# Patient Record
Sex: Female | Born: 1955 | Race: White | Hispanic: No | Smoking: Current every day smoker
Health system: Southern US, Community
[De-identification: ages and names within clinical notes are randomized; demographics above are authoritative.]

## PROBLEM LIST (undated history)

## (undated) DIAGNOSIS — F419 Anxiety disorder, unspecified: Secondary | ICD-10-CM

## (undated) DIAGNOSIS — E785 Hyperlipidemia, unspecified: Secondary | ICD-10-CM

## (undated) DIAGNOSIS — K631 Perforation of intestine (nontraumatic): Secondary | ICD-10-CM

## (undated) DIAGNOSIS — I10 Essential (primary) hypertension: Secondary | ICD-10-CM

## (undated) HISTORY — DX: Hyperlipidemia, unspecified: E78.5

## (undated) HISTORY — DX: Anxiety disorder, unspecified: F41.9

## (undated) HISTORY — PX: COLOSTOMY: SHX63

---

## 2014-12-10 ENCOUNTER — Inpatient Hospital Stay: Payer: Self-pay | Admitting: Surgery

## 2014-12-24 DIAGNOSIS — J439 Emphysema, unspecified: Secondary | ICD-10-CM | POA: Insufficient documentation

## 2014-12-24 DIAGNOSIS — I10 Essential (primary) hypertension: Secondary | ICD-10-CM | POA: Insufficient documentation

## 2014-12-24 DIAGNOSIS — Z9049 Acquired absence of other specified parts of digestive tract: Secondary | ICD-10-CM | POA: Insufficient documentation

## 2015-01-11 ENCOUNTER — Emergency Department: Payer: Self-pay | Admitting: Emergency Medicine

## 2015-02-24 LAB — SURGICAL PATHOLOGY

## 2015-03-02 NOTE — Discharge Summary (Signed)
PATIENT NAME:  Jacqueline Perez, Jacqueline Perez MR#:  [id number] DATE OF BIRTH:  October 22, 1956  DATE OF ADMISSION:  12/10/2014 DATE OF DISCHARGE:  12/20/2014  FINAL DIAGNOSES:  1. Perforated sigmoid diverticulitis.  2. Tobacco abuse and dependence.  3. Chronic obstructive pulmonary disease.   PRINCIPAL PROCEDURES:  1. Hartman procedure.  2. CT scan.  3. Internal medicine consultation.   HOSPITAL COURSE SUMMARY: The patient was admitted initially with what appeared to be Perez contained perforation of sigmoid diverticulitis. By hospital day number 1, she had an acute change in her in her clinical status with hypoxia and severe abdominal pain. She was taken to the Operating Room for Perez laparotomy and exploratory laparotomy and sigmoid colectomy with end colostomy and peritoneal lavage was performed. Postoperatively, she remained in the Intensive Care Unit due to hypoxia. She was able to be managed without ventilatory support. She was on high flow oxygen. Her nasogastric tube was able to be removed. She was continued on intravenous antibiotics postoperatively. On postoperative day number 2, she continued to have some mild hypoxia, however, her ostomy was pink and viable. She was started on clears. Intravenous antibiotics were continued. On postoperative day number 3, the patient continued to improve. Her Foley catheter was removed. She did not want any more intravenous antibiotics. This is causing her some hallucinations. She was transitioned over to oral pain medications. Her white count was 12.5. Her hemoglobin remained stable. Her ostomy began functioning. Her diet was able to be slowly advanced. Her oxygen was able to be weaned. By postoperative day number 4, the patient continued to improve. Her diet was able to be advanced. Her potassium was repleted. On postoperative day number 5, the patient had some ostomy care. She continued to improve. On postoperative day number 6, the patient looked to be continuing to need oxygen  with any type of mild exercise. Arrangements were made for her to potentially have home oxygen. She did have some flank pain on the 17th, which was thought to be musculoskeletal in  etiology. Perez CT scan of the chest was negative for pulmonary embolism. On the 18th, the patient's pain was resolved. Room air saturations with ambulating were 88%. The patient was discharged home in stable condition on 12/20/2014 with office followup.   MEDICATIONS: Can be found on reconciliation form.  ____________________________ Redge GainerMark Perez. Egbert GaribaldiBird, MD mab:ap D: 12/31/2014 19:31:30 ET T: 01/01/2015 09:13:20 ET JOB#: [id number]  cc: Loraine LericheMark Perez. Egbert GaribaldiBird, MD, <Dictator> Raynald KempMARK Perez Daila Elbert MD ELECTRONICALLY SIGNED 01/02/2015 14:47

## 2015-03-02 NOTE — Consult Note (Signed)
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   Patient was seen by consultant.   Consult note dictated.   Recommend further assessment or treatment.   Orders entered.   Comments: 1. Hypoxia, likley multifactorial, due 5to underlying emphysema  as pt reports intermittent desaturations in the past, also poor inspiratory effort due to abdominal pain, distension, opiate use in ER, get ABg's to r/o CO2 retention, order insentive spirometry, Duonebs, get sputum cx, now on zosyn 2. Myoclonus, r.o CO2 retention, ABG's 3. perforated viscus, pain meds, operative treatment as per surgery, agree with zosyn 4. tobacco abuse, nicotine replacement, d/w pt for about 5 minutes, agreeble 5 hyperglycemia, get hgb a1c Thanks for consult, we'll follow.  Electronic Signatures: Katharina CaperVaickute, Eboni Coval (MD)  (Signed 09-Feb-16 19:23)  Authored: Brief Consult Note   Last Updated: 09-Feb-16 19:23 by Katharina CaperVaickute, Gyneth Hubka (MD)

## 2015-03-02 NOTE — Op Note (Signed)
PATIENT NAME:  Jacqueline ForsterCLAYTON, Jacqueline Perez MR#:  [id number] DATE OF BIRTH:  Mar 02, 1956  DATE OF PROCEDURE:  12/11/2014  PREOPERATIVE DIAGNOSIS: Acute abdomen secondary to perforated diverticulitis.   POSTOPERATIVE DIAGNOSIS: Acute abdomen secondary to perforated diverticulitis.   PROCEDURE PERFORMED: Exploratory laparotomy with sigmoid colectomy, end sigmoid colostomy, and peritoneal lavage.   SURGEON: Colinda Barth Perez. Egbert GaribaldiBird, MD  ASSISTANT SURGEON: Sheppard Plumberimothy E. Oaks, MD   TYPE OF ANESTHESIA: General endotracheal.   FINDINGS: There was extensive contamination of the pelvis. There was perforated sigmoid diverticulum. The large bowel was thickened. There was Perez moderate amount of feculent and purulent fluid throughout the pelvis.   SPECIMENS: Sigmoid colon to pathology.   ESTIMATED BLOOD LOSS: 100 mL.   DRAINS: None.   LAPAROTOMY SPONGE AND NEEDLE COUNT: Correct x 2.   DESCRIPTION OF PROCEDURE: With informed consent obtained from the patient and her son, she was brought urgently to the operating room and positioned supine. General oroendotracheal anesthesia was induced without difficulty. The patient's abdomen was widely prepped and draped with ChloraPrep solution and Perez timeout was observed.   Perez midline skin incision was fashioned from above the umbilicus to just above the suprapubic midline, skin divided with scalpel, and musculofascial layers with both scalpel and electrocautery device, and Perez self-retaining abdominal retractor was placed. Seropurulent fluid was then aspirated from the abdomen at this time. The small bowel was packed into the upper abdomen, exposing the inflamed sigmoid diverticulitis in the pelvis. The sigmoid colon was then liberated off the white line of Toldt and the splenic flexure was likewise delivered at this time. This was accomplished by division of the lateral attachments and division of the omentum off the transverse mesocolon.   The proximal colon was divided with Perez fire of the  Contour stapler with Perez green load application. At this point, the sigmoid colon was then removed by division of the sigmoid mesocolon. This was accomplished with Perez combination of electrocautery and LigaSure apparatus, and larger vessels being individually clamped and tied utilizing #0 Vicryl suture. The course of the left ureter was identified and preserved. Distal to the area of inflammation, the colon was divided with Perez 2nd fire of the contoured stapler with green load application. The specimen was handed off the field and submitted to pathology. Gross examination was consistent with benign disease. The left lower quadrant ostomy site was chosen. One mesenteric pedicle was divided on the edge of the proximal mesentery, allowing full mobilization of the colon to create the ostomy. The abdomen was irrigated with several liters of warm normal saline with GU irrigant. Hemostasis appeared to be excellent on the operative field. The distal rectal stump was identified with Perez silk suture. With laparotomy sponge and needle count correct x 2, the abdomen was again irrigated, small bowel was run in its entirety. One area of small bowel in the ileum was separated from what appeared to be early interloop abscesses.   The left lower quadrant ostomy site was then chosen through the rectus sheath, below the umbilicus on the left side. Perez wheal of skin was excised. Perez cruciate incision was fashioned in the fascia anteriorly. The rectus muscle was split. The peritoneum was divided up likewise in Perez cruciate fashion, and the end of the sigmoid colon was easily brought up through this. It was secured at the abdominal side to the peritoneum with several #3-0 silk sutures. With laparotomy sponge and needle count correct x 2, the midline fascia was then reapproximated from the extremes of  the wound utilizing running looped #1 PDS. Subcutaneous tissues were irrigated and aspirated dry. Skin edges were reapproximated utilizing Perez skin  stapler. Being excluded from the wound, the ostomy was then matured by excision of the staple line, and the edges of the colon being secured to the edges of the skin, utilizing 3-0 chromic suture interrupted. The ostomy appeared congested, but viable. An ostomy appliance was placed. Sterile dressings were placed. The patient was subsequently extubated and taken to the recovery room in stable and satisfactory condition by anesthesia services.     ____________________________ Redge Gainer Egbert Garibaldi, MD mab:mw D: 12/12/2014 07:13:21 ET T: 12/12/2014 12:42:29 ET JOB#: [id number]  cc: Loraine Leriche Perez. Egbert Garibaldi, MD, <Dictator> Raynald Kemp MD ELECTRONICALLY SIGNED 12/17/2014 16:09

## 2015-03-02 NOTE — Consult Note (Signed)
PATIENT NAME:  Jacqueline Perez, CIRILLO MR#:  [id number] DATE OF BIRTH:  [birth year]  DATE OF CONSULTATION:  12/10/2014  ADMITTED BY:  Loraine Leriche A. Egbert Garibaldi, MD CONSULTING PHYSICIAN:  Katharina Caper, MD  HISTORY OF PRESENT ILLNESS: The patient is a 59 year old widowed Caucasian female with history of ongoing tobacco abuse, who has been smoking for the past about 40 pack years who presents to the hospital with abdominal pains. She had radiologic studies done, which were suggestive of perforated viscus and the patient was admitted by Dr. Egbert Garibaldi.  After admission, the patient was given some pain medication and she desaturated. Her O2 saturations went down to 80s. When she started walking her O2 saturations even worsened. The patient herself admits of having some intermittent desaturations in the past, but she denies any history of COPD.  She admits to coughing recently and having sore throat and just feeling bad. She does have yellow phlegm production for the past 1-2 days.  She has been also wheezing for the past 3-4 days and short of breath. She admits to having some right-sided chest pain radiating to her back coming from her abdomen. She tells me that she cannot take deep breath because of some pains in her abdomen as well as distention of her abdomen.  PAST MEDICAL HISTORY:  Significant for history of suspected COPD with desaturations, low oxygen levels in the past, history of sinus infection, and ongoing smoking, hypoglycemia, according to patient.  MEDICATIONS: None at home.   PAST SURGICAL HISTORY:  Tubal ligation in the past.   ALLERGIES: SULFA, WHICH GIVES HER HIVES.   FAMILY HISTORY: The patient's cousin had emphysema. Patient's grandmother with diabetes mellitus.  The patient's parents both had hypertension, maternal uncle had colon cancer.  SOCIAL HISTORY: The patient is single. Has a son and daughter. Has been smoking less than 3/4 of a pack of cigarettes for the past 42 years at least and drinks alcohol as  well, 1 bottle of wine once or twice a week. 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  She used to work in Public relations account executive, as well as Theatre stage manager.   REVIEW OF SYSTEMS:  CONSTITUTIONAL:  Positive for feeling feverish today, also feeling fatigue and weak, pains in the abdomen, as well as the rest of the chest radiating into the back. She admits of weight gain approximately 20-30 pounds in the past 2 years and there is some blurring of vision and evidence of cough, wheezing, dyspnea, shortness of breath and nasal bleed earlier today. Admits to chest pains in the past and feeling presyncopal. Admits to nausea and vomiting and abdominal pains in the lower abdomen mostly, but also radiating to the right side of the abdomen into the chest and into the back. Last bowel movement, normal bowel movement, was approximately a week ago. Denies any weight loss. EYES: Denies any double vision, glaucoma, cataracts. EARS, NOSE, THROAT: Denies any tinnitus, allergies, epistaxis, sinus pain, dentures, difficulty swallowing. Admits to having bleeding nose after blowing nose. RESPIRATORY: Denies any asthma, COPD.  CARDIOVASCULAR: Denies any orthopnea, edema, arrhythmias, palpitations. GASTROINTESTINAL: Denies any diarrhea, hematemesis, rectal bleeding, change in bowel habits.  GENITOURINARY: Denies dysuria, hematuria, frequency or incontinence. ENDOCRINOLOGY: Denies any polydipsia, nocturia, thyroid problems, heat or cold intolerance, or thirst.  HEMATOLOGIC: Denies anemia, easy bruising, or bleeding, swollen glands.  SKIN: Denies acne, rash, lesions, or change in moles.  MUSCULOSKELETAL: Denies arthritis, cramps, or swelling. NEUROLOGIC: Denies numbness, epilepsy, or tremor.  PSYCHIATRIC: Denies anxiety, insomnia, or depression.  PHYSICAL EXAMINATION:  VITAL SIGNS: On arrival to the hospital the patient's temperature was 98, pulse was 93, respiratory rate was 21, blood  pressure 124/78, saturation was 94% on room air. GENERAL: The patient is a well-developed, well-nourished, Caucasian female in mild to moderate discomfort, lying on the stretcher. She is moving on the stretcher from 1 side to the other side because of the pain and discomfort. HEENT: Her pupils are equal, reactive to light. Extraocular muscles intact, no icterus or conjunctivitis. Has normal hearing. No pharyngeal erythema. Mucosa is dry. NECK: No masses. Supple, nontender. Thyroid is not enlarged. No adenopathy. No JVD or carotid bruits bilaterally. Full range of motion.  LUNGS: Clear to auscultation, somewhat diminished breath sounds, but there were no rales or rhonchi, wheezing, no dullness to percussion, not in overt respiratory distress; however, patient guarding to take a deep breath because of abdominal distention as well as discomfort.  CARDIOVASCULAR: S1, S2 appreciated. The rhythm is regular. PMI not lateralized. Chest is nontender to palpation,  1+ pedal pulses.  No lower extremity edema, calf tenderness, or cyanosis was noted.   ABDOMEN:  Distended, mildly firm. No hepatosplenomegaly or masses were noted.  Bowel sounds were diminished.  MUSCLE STRENGTH: Able to move all extremities. No cyanosis, degenerative joint disease, or kyphosis. Gait was not tested.  SKIN: Did not reveal any rashes, lesions, erythema, nodularity, or induration. It was warm and dry to palpation. LYMPHATIC: No adenopathy in the cervical region.  NEUROLOGIC: Cranial nerves were intact.  Sensory was intact.  No dysarthria or aphasia.  PSYCHIATRIC: The patient is alert, oriented to person and place, but patient is somnolent intermittently and she has myoclonic jerks. She is awakened though and she is able to communicate, but otherwise poorly cooperative. She is somewhat dramatic and restless.  LABORATORY DATA: BMP showed a glucose level of 121, lipase level of 60. Otherwise BMP was normal. Liver enzymes were normal. Cardiac  enzymes, troponin was less than 0.02. White blood cell count was normal at 8.0, hemoglobin was 14.8. Platelet count was 274,000. Urinalysis was remarkable for yellow clear urine, negative for glucose or bilirubin, 1+ ketones were noted. Specific gravity 1.015, pH was 6.0, negative for blood, protein, nitrites or leukocyte esterase, 1 red blood cell, less than 1 white blood cell. No bacteria, less than 1 epithelial cell, mucus was present as well as to 5 hyaline casts.   RADIOLOGIC STUDIES: Chest x-ray, portable, single view, 12/10/2014, showed low lung volumes. No definite acute disease. CT scan of abdomen and pelvis with contrast, 12/10/2014, revealed extraluminal gas bubbles and fluid in the pelvis indicating perforated viscus, probably secondary to diverticulitis or perforated sigmoid carcinoma. Recommend surgical consultation. Normal appendix as well as cholelithiasis. Ultrasound of abdomen, general survey, 12/10/2014, showed multiple gallstones, nonvisualization of the pancreas and distal abdominal aorta.   ASSESSMENT AND PLAN: 1.  Hypoxia, likely multifactorial due to underlying emphysema as patient reports intermittent desaturations in the past, also poor inspiratory effort due to abdominal pain and distention and opiate use in the Emergency Room. We will get ABGs to rule out CO2 retention and we will order incentive spirometry, also DuoNeb. Will get sputum cultures and will continue the patient on Zosyn.  2.  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 Will get ABGs.  3.  Perforated viscus.  Pain medications as per surgery. Operative therapy as per surgery. Will  agree to Zosyn and follow the patient's ABGs and make decisions about BiPAP if needed.  4.  Hyperglycemia. Get hemoglobin A1c.  5.  Tobacco abuse. Nicotine replacement therapy will be initiated. This was discussed, cessation, for approximately 4-5 minutes. She was agreeable.  TIME SPENT: 50 minutes.    ____________________________ Katharina Caper, MD rv:LT D: 12/10/2014 19:32:09 ET T: 12/10/2014 20:34:38 ET JOB#: [id number]  cc: Katharina Caper, MD, <Dictator> Cesilia Shinn MD ELECTRONICALLY SIGNED 01/14/2015 10:44

## 2015-03-02 NOTE — H&P (Signed)
Subjective/Chief Complaint severe lower abdominal pain   History of Present Illness 59 y/o smoker without medical history presents with about 1 week history of lower abdominal pain, became abruptly worse in last 12 hours with distension, nausea.  Arrives in ER with severe abdominal pain and distension.  No fevers, no sick contacts.   Past History smoker.   Code Status Full Code   Past Med/Surgical Hx:  HTN:   Tubal Ligation:   ALLERGIES:  Sulfa drugs: Rash  Family and Social History:  Family History Smoking   Social History negative tobacco, negative ETOH, negative Illicit drugs   + Tobacco Current (within 1 year)   Place of Living Home   Review of Systems:  Subjective/Chief Complaint see above.   Abdominal Pain Yes   Diarrhea No   Constipation No   Nausea/Vomiting Yes   SOB/DOE No   Chest Pain No   Tolerating Diet No  Nauseated   ROS remaing review of system negative.   Medications/Allergies Reviewed Medications/Allergies reviewed   Physical Exam:  GEN disheveled, critically ill appearing, 98 p92 124/78   HEENT pale conjunctivae, PERRL   NECK supple  No masses   RESP normal resp effort  clear BS   CARD regular rate   ABD positive tenderness  no hernia  soft  distended  normal BS  tenderness throughout the abdomen,   LYMPH negative neck   EXTR negative cyanosis/clubbing   SKIN normal to palpation, No rashes   NEURO cranial nerves intact   PSYCH A+O to time, place, person, good insight   Lab Results: Hepatic:  09-Feb-16 11:47   Bilirubin, Total 0.6  Alkaline Phosphatase 72  SGPT (ALT) 24  SGOT (AST) 23  Total Protein, Serum 7.4  Albumin, Serum 3.6  Routine Chem:  09-Feb-16 11:47   Lipase  60 (Result(s) reported on 10 Dec 2014 at 01:59PM.)  Glucose, Serum  121  BUN 13  Creatinine (comp) 0.85  Sodium, Serum 139  Potassium, Serum 3.6  Chloride, Serum 105  CO2, Serum 25  Calcium (Total), Serum 9.0  Osmolality (calc) 279  eGFR  (African American) >60  eGFR (Non-African American) >60 (eGFR values <58m/min/1.73 m2 may be an indication of chronic kidney disease (CKD). Calculated eGFR, using the MRDR Study equation, is useful in  patients with stable renal function. The eGFR calculation will not be reliable in acutely ill patients when serum creatinine is changing rapidly. It is not useful in patients on dialysis. The eGFR calculation may not be applicable to patients at the low and high extremes of body sizes, pregnant women, and vegetarians.)  Anion Gap 9  Cardiac:  09-Feb-16 11:47   Troponin I < 0.02 (0.00-0.05 0.05 ng/mL or less: NEGATIVE  Repeat testing in 3-6 hrs  if clinically indicated. >0.05 ng/mL: POTENTIAL  MYOCARDIAL INJURY. Repeat  testing in 3-6 hrs if  clinically indicated. NOTE: An increase or decrease  of 30% or more on serial  testing suggests a  clinically important change)  Routine UA:  09-Feb-16 11:47   Color (UA) Yellow  Clarity (UA) Clear  Glucose (UA) Negative  Bilirubin (UA) Negative  Ketones (UA) 1+  Specific Gravity (UA) 1.015  Blood (UA) Negative  pH (UA) 6.0  Protein (UA) Negative  Nitrite (UA) Negative  Leukocyte Esterase (UA) Negative (Result(s) reported on 10 Dec 2014 at 03:31PM.)  RBC (UA) 1 /HPF  WBC (UA) <1 /HPF  Bacteria (UA) NONE SEEN  Epithelial Cells (UA) <1 /HPF  Mucous (UA) PRESENT  Hyaline Cast (UA) 5 /LPF (Result(s) reported on 10 Dec 2014 at 03:31PM.)  Routine Hem:  09-Feb-16 11:47   WBC (CBC) 8.0  RBC (CBC) 4.52  Hemoglobin (CBC) 14.8  Hematocrit (CBC) 44.9  Platelet Count (CBC) 274 (Result(s) reported on 10 Dec 2014 at 01:49PM.)  MCV 99  MCH 32.8  MCHC 33.0  RDW 13.2   Radiology Results: [place]:    09-Feb-16 13:17, US Abdomen General Survey  US Abdomen General Survey  REASON FOR EXAM:    abd pain  COMMENTS:       PROCEDURE: [place]  - US ABDOMEN GENERAL SURVEY  - Dec 10 2014  1:17PM     CLINICAL DATA:  Progressive abdominal pain.   Nausea.    EXAM:  ULTRASOUND ABDOMEN COMPLETE    COMPARISON:  None.    FINDINGS:  Gallbladder: There are multiple mobile gallstones. Gallbladder wall  is not thickened.  Common bile duct: Diameter: 5.1 mm, normal.    Liver: No focal lesion identified. Within normal limits in  parenchymal echogenicity.    IVC: No abnormality visualized.    Pancreas: Not visualized.    Spleen: Normal.  9.1 cm in length.    Right Kidney: Length: 10.1 cm. Echogenicity within normal limits. No  mass or hydronephrosis visualized.    Left Kidney: Length: 10.2 cm. Echogenicity within normal limits. No  mass or hydronephrosis visualized.    Abdominal aorta: Incompletely visualized. Proximal portion is 2.7 cm  maximal diameter.    Other findings: None.     IMPRESSION:  Multiple gallstones. Nonvisualization of the pancreas and distal  abdominal aorta.      Electronically Signed    By: Lorriane Shire M.D.    On: 12/10/2014 13:41     Verified By: Larey Seat, M.D.,  [place]:  PACS Image    09-Feb-16 15:39, CT Abdomen and Pelvis With Contrast  PACS Image  CT:  CT Abdomen and Pelvis With Contrast  REASON FOR EXAM:    (1) abd pain; (2) pel pain  COMMENTS:       PROCEDURE: CT  - CT ABDOMEN / PELVIS  W  - Dec 10 2014  3:39PM     CLINICAL DATA:  Lower abdominal pain since last Tuesday. Patient  states pain began approx one week ago, but was not constant. Patient  states she developed severe RUQ last night. +Nausea.Hx of Tubal  Ligation.    EXAM:  CT ABDOMEN AND PELVIS WITH CONTRAST    TECHNIQUE:  Multidetector CT imaging of the abdomen and pelvis was performed  using the standard protocol following bolus administration of  intravenous contrast.    CONTRAST:  100 mL Omnipaque 300 IV    COMPARISON:  None.    FINDINGS:  Minimal dependent atelectasis in the visualized lung bases. There is  a small amount of perihepatic ascites. Multiple partially calcified  stones in the  dependent aspect of the nondilated gallbladder.  Unremarkable liver, spleen, adrenal glands, kidneys, pancreas.  Aortoiliac atheromatous plaque without aneurysm or stenosis. Stomach  is physiologically distended with oral contrast. Small bowel and  colon are nondilated. Normal appendix. Multiple diverticula from  distal descending and sigmoid portions of the colon. There is  irregular wall thickening in the mid sigmoid colon with adjacent  inflammatory/ edematous changes. Scattered extraluminal small gas  bubbles in the right pelvis with a small amount of ascites. Uterus  and adnexal regions unremarkable. Bilateral pelvic phleboliths. No  adenopathy. Grade 1 anterolisthesis L5-S1 with  advanced degenerative  disc disease. No pars defect.     IMPRESSION:  1. Extraluminal gas bubbles and fluid in the pelvis indicating  perforated viscus, probably secondary to diverticulitis or  perforated sigmoid carcinoma. Recommend surgical consultation.  2. Normal appendix.  3.Cholelithiasis.  Electronically Signed    By: Lucrezia Europe M.D.    On: 12/10/2014 16:07         Verified By: Kandis Cocking, M.D.,    Assessment/Admission Diagnosis 59 y/o female with contained perforation sigmoid diverticulitis no prior colonoscopy pain looks more severe than I would expect given CT scan findings.   Plan admit, npo, hydration IV zosyn/flagyl. serial exam. I will re-assess and suspect if no improvement may need surgical intervention.   Electronic Signatures: Sherri Rad (MD)  (Signed 09-Feb-16 17:21)  Authored: CHIEF COMPLAINT and HISTORY, PAST MEDICAL/SURGIAL HISTORY, ALLERGIES, FAMILY AND SOCIAL HISTORY, REVIEW OF SYSTEMS, PHYSICAL EXAM, LABS, Radiology, ASSESSMENT AND PLAN   Last Updated: 09-Feb-16 17:21 by Sherri Rad (MD)

## 2016-05-16 IMAGING — CR DG CHEST 1V
1 series · 1 of 1 positions shown · non-contrast
Comparison: 12/14/2014

CLINICAL DATA: Shortness of breath

EXAM:
CHEST  1 VIEW

[ap]
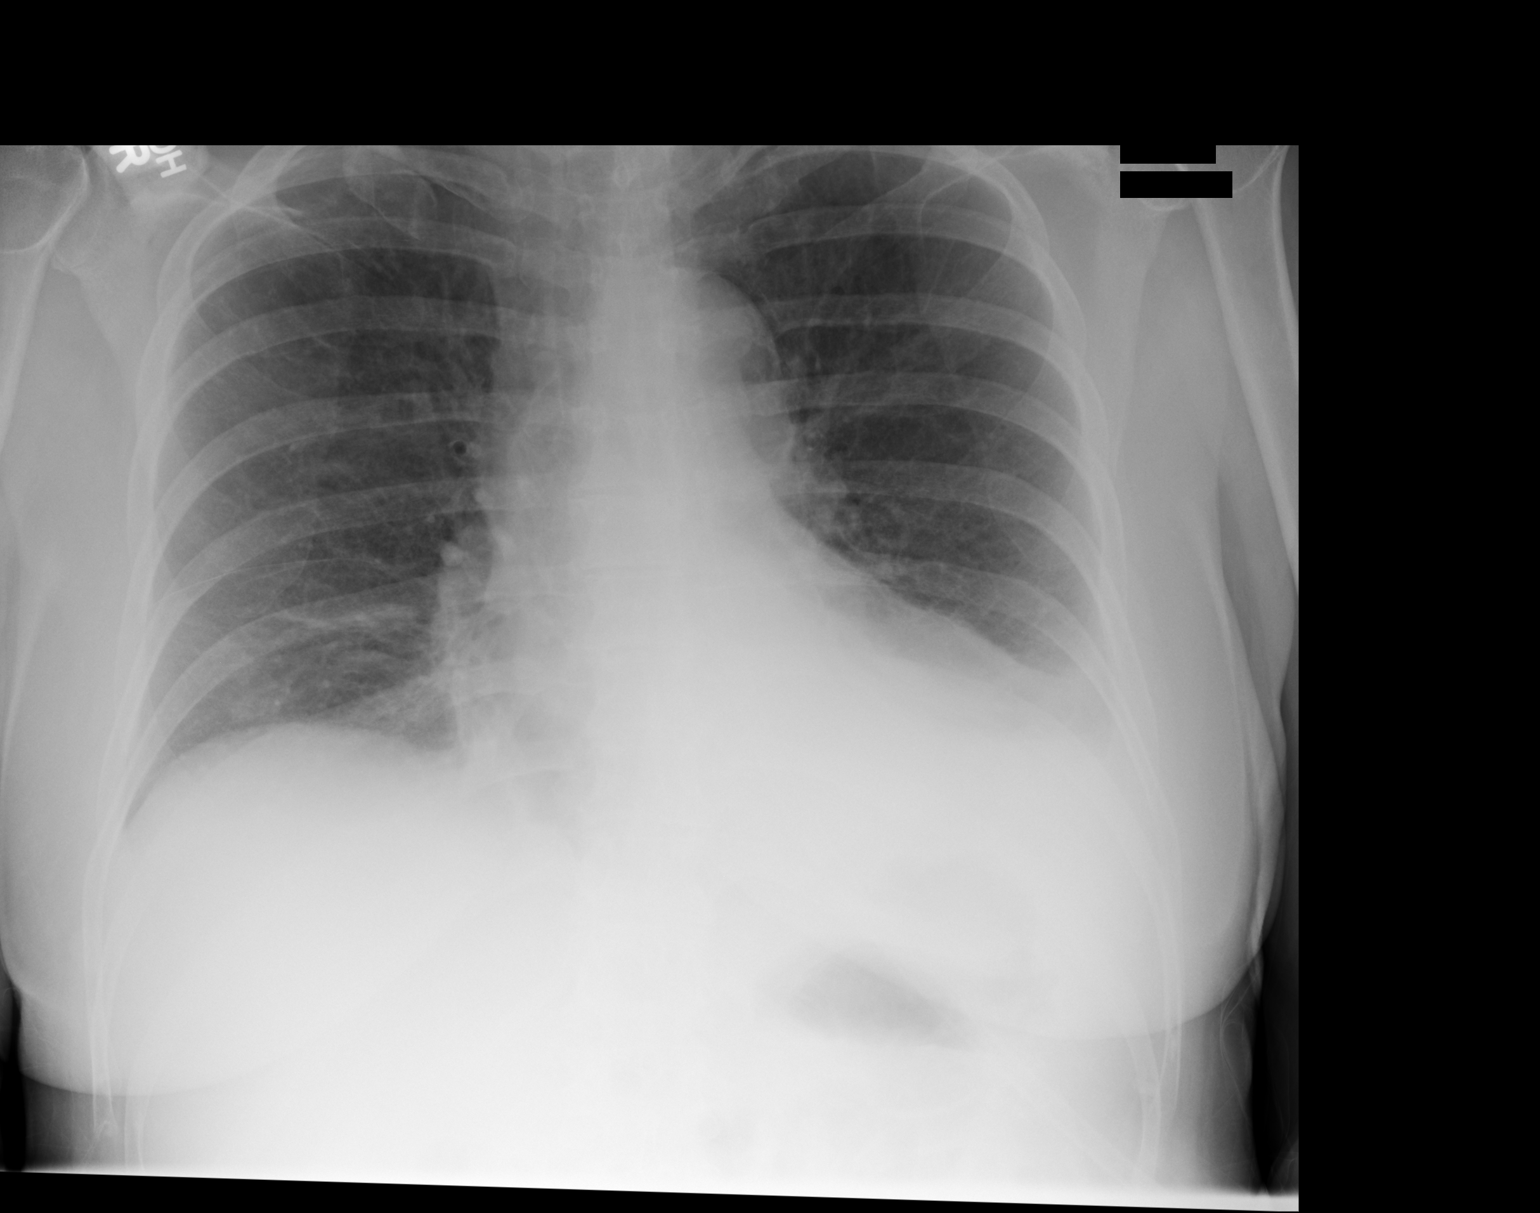

[1 of 1 positions shown; findings below may reference images not displayed]

FINDINGS: Aeration is improved with apparent improvement in previously seen
interstitial edema. Residual trace left pleural effusion is
identified. Mild enlargement of the cardiac silhouette is noted. The
aorta is unfolded and ectatic.
IMPRESSION: Improving probable interstitial pulmonary edema.

## 2016-06-22 DIAGNOSIS — I10 Essential (primary) hypertension: Secondary | ICD-10-CM

## 2016-06-22 DIAGNOSIS — J439 Emphysema, unspecified: Secondary | ICD-10-CM

## 2016-06-22 DIAGNOSIS — Z9049 Acquired absence of other specified parts of digestive tract: Secondary | ICD-10-CM

## 2016-11-10 ENCOUNTER — Telehealth: Payer: Self-pay | Admitting: Pharmacist

## 2016-11-10 NOTE — Telephone Encounter (Signed)
ProAir refill PAP submitted to manufacturer today. °

## 2017-01-28 ENCOUNTER — Telehealth: Payer: Self-pay | Admitting: Pharmacist

## 2017-01-28 NOTE — Telephone Encounter (Signed)
01/28/17 Faxed PAP Renewal application to [employer] for ProAir Inhale 2 puffs every 4-6 hours as needed for wheezing, cough or shortness of breath.

## 2017-02-18 ENCOUNTER — Telehealth: Payer: Self-pay | Admitting: Pharmacist

## 2017-02-18 NOTE — Telephone Encounter (Signed)
02/18/2017 Patient eligibile with [facility] till April 2019. AJohnson

## 2017-03-16 ENCOUNTER — Emergency Department
Admission: EM | Admit: 2017-03-16 | Discharge: 2017-03-16 | Disposition: A | Discharge status: Home / Self Care | Payer: Self-pay | Attending: Emergency Medicine | Admitting: Emergency Medicine

## 2017-03-16 ENCOUNTER — Emergency Department: Payer: Self-pay

## 2017-03-16 ENCOUNTER — Encounter: Payer: Self-pay | Admitting: Emergency Medicine

## 2017-03-16 DIAGNOSIS — R1013 Epigastric pain: Secondary | ICD-10-CM | POA: Insufficient documentation

## 2017-03-16 DIAGNOSIS — I10 Essential (primary) hypertension: Secondary | ICD-10-CM | POA: Insufficient documentation

## 2017-03-16 DIAGNOSIS — F17210 Nicotine dependence, cigarettes, uncomplicated: Secondary | ICD-10-CM | POA: Insufficient documentation

## 2017-03-16 DIAGNOSIS — R079 Chest pain, unspecified: Secondary | ICD-10-CM | POA: Insufficient documentation

## 2017-03-16 HISTORY — DX: Perforation of intestine (nontraumatic): K63.1

## 2017-03-16 HISTORY — DX: Essential (primary) hypertension: I10

## 2017-03-16 LAB — CBC
HCT: 37.1 % (ref 35.0–47.0)
Hemoglobin: 13.1 g/dL (ref 12.0–16.0)
MCH: 35 pg — ABNORMAL HIGH (ref 26.0–34.0)
MCHC: 35.3 g/dL (ref 32.0–36.0)
MCV: 99.3 fL (ref 80.0–100.0)
PLATELETS: 351 10*3/uL (ref 150–440)
RBC: 3.73 MIL/uL — ABNORMAL LOW (ref 3.80–5.20)
RDW: 13.1 % (ref 11.5–14.5)
WBC: 7.1 10*3/uL (ref 3.6–11.0)

## 2017-03-16 LAB — HEPATIC FUNCTION PANEL
ALBUMIN: 4.2 g/dL (ref 3.5–5.0)
ALT: 23 U/L (ref 14–54)
AST: 33 U/L (ref 15–41)
Alkaline Phosphatase: 46 U/L (ref 38–126)
BILIRUBIN TOTAL: 0.6 mg/dL (ref 0.3–1.2)
Bilirubin, Direct: <0.1 mg/dL — ABNORMAL LOW (ref 0.1–0.5)
Total Protein: 7.2 g/dL (ref 6.5–8.1)

## 2017-03-16 LAB — BASIC METABOLIC PANEL
Anion gap: 9 (ref 5–15)
BUN: 17 mg/dL (ref 6–20)
CALCIUM: 9.5 mg/dL (ref 8.9–10.3)
CHLORIDE: 105 mmol/L (ref 101–111)
CO2: 25 mmol/L (ref 22–32)
CREATININE: 0.9 mg/dL (ref 0.44–1.00)
Glucose, Bld: 89 mg/dL (ref 65–99)
Potassium: 3.8 mmol/L (ref 3.5–5.1)
SODIUM: 139 mmol/L (ref 135–145)

## 2017-03-16 LAB — TROPONIN I

## 2017-03-16 LAB — LIPASE, BLOOD: LIPASE: 40 U/L (ref 11–51)

## 2017-03-16 NOTE — ED Notes (Addendum)
Pt called to inform her that discharge paperwork would be left at front desk. Pt stated that she received paperwork by nurse "at the front". Erskine SquibbJane first nurse called, she stated that pt was in lobby and came up to front desk and asked for paperwork. Erskine SquibbJane stated that she retrieved paperwork from MD Schaevitz and went over it w/ pt.

## 2017-03-16 NOTE — ED Provider Notes (Signed)
[facility] Emergency Department Provider Note  ____________________________________________   First MD Initiated Contact with Patient 03/16/17 1652     (approximate)  I have reviewed the triage vital signs and the nursing notes.   HISTORY  Chief Complaint Chest Pain   HPI Jacqueline Perez is a 61 y.o. female with a history of hypertension as well as smoking who is presenting to the emergency department today with 3 hours of epigastric and chest pain. She says the pain started suddenly at about noon and lasted for 3 hours. She describes as a severe burning pain that was about a 9 out of 10. She says it radiated to both shoulders to her back into her jaw. She says that it felt like "severe indigestion." She says that it was constant for about 3 hours until she burped and then it started to alleviate. She is currently pain-free at this time. Denies any difficulty breathing. Says that she was cleaning when the pain started. Has not had episodes of pain like this before. Is a smoker but denies any coronary artery disease. No history of coronary artery disease in her family.Patient said that she was nauseous but did not vomit. No diaphoresis. Triage note says that she was short of breath. However, when I discussed with her what it meant to be short of breath she denies any difficulty with breathing and any rapid respirations.   Past Medical History:  Diagnosis Date  . Hypertension   . Rupture of colon [facility])     Patient Active Problem List   Diagnosis Date Noted  . Hypertension 12/24/2014  . Emphysema of lung (HCC) 12/24/2014  . S/P colon resection 12/24/2014    Past Surgical History:  Procedure Laterality Date  . COLOSTOMY      Prior to Admission medications   Medication Sig Start Date End Date Taking? Authorizing Provider  albuterol (VENTOLIN HFA) 108 (90 Base) MCG/ACT inhaler Inhale 2 puffs into the lungs every 4 (four) hours as needed for wheezing or  shortness of breath.   Yes [provider]  amLODipine (NORVASC) 10 MG tablet Take 10 mg by mouth daily.   Yes [provider]  aspirin EC 81 MG tablet Take 81 mg by mouth daily.   Yes [provider]  Garlic 2000 MG CAPS Take 2,000 mg by mouth daily.   Yes [provider]  hydrOXYzine (ATARAX/VISTARIL) 25 MG tablet Take 25 mg by mouth every 6 (six) hours as needed for anxiety. 02/25/17  Yes [provider]  ibuprofen (ADVIL,MOTRIN) 800 MG tablet Take 800 mg by mouth every 8 (eight) hours as needed for pain. 01/28/17  Yes [provider]  lidocaine (LIDODERM) 5 % Place 1 patch onto the skin daily as needed. Apply to affected area for 12 hours   Yes [provider]  losartan (COZAAR) 50 MG tablet Take 50 mg by mouth daily.   Yes [provider]  sertraline (ZOLOFT) 25 MG tablet Take 25 mg by mouth daily. 02/26/17 03/28/17 Yes [provider]    Allergies Morphine and related and Sulfa antibiotics  No family history on file.  Social History Social History  Substance Use Topics  . Smoking status: Current Every Day Smoker    Types: Cigarettes  . Smokeless tobacco: Never Used  . Alcohol use Yes    Review of Systems  Constitutional: No fever/chills Eyes: No visual changes. ENT: No sore throat. Cardiovascular:as above Respiratory: Denies shortness of breath. Gastrointestinal: No abdominal  pain.  No nausea, no vomiting.  No diarrhea.  No constipation. Genitourinary: Negative for dysuria. Musculoskeletal: Negative for back pain. Skin: Negative for rash. Neurological: Negative for headaches, focal weakness or numbness.   ____________________________________________   PHYSICAL EXAM:  VITAL SIGNS: ED Triage Vitals [03/16/17 1420]  Enc Vitals Group     BP 111/68     Pulse Rate 63     Resp 20     Temp 97.8 F (36.6 C)     Temp Source Oral     SpO2 99 %     Weight 132 lb (59.9 kg)     Height 5' 3.5"  (1.613 m)     Head Circumference      Peak Flow      Pain Score 8     Pain Loc      Pain Edu?      Excl. in GC?     Constitutional: Alert and oriented. Well appearing and in no acute distress. Eyes: Conjunctivae are normal.  Head: Atraumatic. Nose: No congestion/rhinnorhea. Mouth/Throat: Mucous membranes are moist.  Neck: No stridor.   Cardiovascular: Normal rate, regular rhythm. Grossly normal heart sounds.   Respiratory: Normal respiratory effort.  No retractions. Lungs CTAB. Gastrointestinal: Soft and nontender. No distention. No CVA tenderness. Musculoskeletal: No lower extremity tenderness nor edema.  No joint effusions. Neurologic:  Normal speech and language. No gross focal neurologic deficits are appreciated. Skin:  Skin is warm, dry and intact. No rash noted. Psychiatric: Mood and affect are normal. Speech and behavior are normal.  ____________________________________________   LABS (all labs ordered are listed, but only abnormal results are displayed)  Labs Reviewed  CBC - Abnormal; Notable for the following:       Result Value   RBC 3.73 (*)    MCH 35.0 (*)    All other components within normal limits  HEPATIC FUNCTION PANEL - Abnormal; Notable for the following:    Bilirubin, Direct <0.1 (*)    All other components within normal limits  BASIC METABOLIC PANEL  TROPONIN I  LIPASE, BLOOD   ____________________________________________  EKG  ED ECG REPORT I, Arelia LongestSchaevitz,  Azia Toutant M, the attending physician, personally viewed and interpreted this ECG.   Date: 03/16/2017  EKG Time: 1416  Rate: 66  Rhythm: normal EKG, normal sinus rhythm, unchanged from previous tracings, normal sinus rhythm  Axis: Normal  Intervals:none  ST&T Change: No ST segment elevation or depression. T-wave inversions in V2 and V3 which are unchanged from the previous EKG on the record.  ____________________________________________  RADIOLOGY On chest x-ray` Mild atelectasis. Improved  effusion. ____________________________________________   PROCEDURES  Procedure(s) performed:   Procedures  Critical Care performed:   ____________________________________________   INITIAL IMPRESSION / ASSESSMENT AND PLAN / ED COURSE  Pertinent labs & imaging results that were available during my care of the patient were reviewed by me and considered in my medical decision making (see chart for details).  ----------------------------------------- 5:25 PM on 03/16/2017 -----------------------------------------  Patient saying that she must leave at this time. She is asymptomatic. I did discuss with her for the lab work in the emergency department to make sure that there is no stress on her heart and that she does not need to stay in the hospital. However, she is insistent on leaving. She is understanding of the severity of her illness and has capacity and insight to make her medical decisions. We discussed following up within 72 hours with a cardiologist. She says that she will  call immediately tomorrow morning once her doctor's office opens for referral for cardiology.      ____________________________________________   FINAL CLINICAL IMPRESSION(S) / ED DIAGNOSES  Chest pain    NEW MEDICATIONS STARTED DURING THIS VISIT:  New Prescriptions   No medications on file     Note:  This document was prepared using Dragon voice recognition software and may include unintentional dictation errors.     Myrna Blazer, MD 03/16/17 1728

## 2017-03-16 NOTE — ED Triage Notes (Signed)
Pt reports central chest pain that radiates to both shoulders and both sides of jaw and back. Pt reports nausea and SOB. Pt states she has been under a lot of stress lately as well.

## 2017-03-16 NOTE — ED Notes (Signed)
Pt no longer in room. Personal belongings no longer in room, officer sts that she saw female leaving room.  Pt VSS, ambulatory at baseline.

## 2017-04-20 ENCOUNTER — Telehealth: Payer: Self-pay | Admitting: Pharmacist

## 2017-04-20 NOTE — Telephone Encounter (Signed)
04/20/17 Called [employer] for refill on ProAir.

## 2017-07-05 ENCOUNTER — Telehealth: Payer: Self-pay | Admitting: Pharmacist

## 2017-07-05 NOTE — Telephone Encounter (Signed)
07/05/17 Received notice from [employer] for refill on Liberty MediaPro Air, marked to refill and faxed back to [employer], Patient ID# [id number].Forde RadonAJ

## 2017-12-26 ENCOUNTER — Encounter (INDEPENDENT_AMBULATORY_CARE_PROVIDER_SITE_OTHER): Payer: Self-pay

## 2017-12-26 ENCOUNTER — Ambulatory Visit: Payer: Self-pay | Admitting: Pharmacist

## 2017-12-26 ENCOUNTER — Other Ambulatory Visit: Payer: Self-pay

## 2017-12-26 DIAGNOSIS — Z79899 Other long term (current) drug therapy: Secondary | ICD-10-CM | POA: Insufficient documentation

## 2017-12-26 NOTE — Progress Notes (Signed)
  [facility] Visit Note  Patient: Jacqueline Perez MRN: [id number] Date of Birth: 11-27-1955 PCP: Pricilla HolmSharpe, Leslie M, MD   Jacqueline Perez 62 y.o. female presents for a follow up MTM visit at medication management clinic today.  Chief complaint of back pain, which she associates with a possible UTI. Patient states she was breaking out in a sweat while in waiting room and has felt 'not herself'. Has appointment with doctor tomorrow morning.   BP (!) 142/80 (BP Location: Right Arm)   Wt 124 lb (56.2 kg)   BMI 21.62 kg/m   Patient Information   Past Medical History:  Diagnosis Date  . Anxiety   . Hyperlipidemia   . Hypertension   . Rupture of colon [facility](HCC)       Past Surgical History:  Procedure Laterality Date  . COLOSTOMY      No family history on file.     Social History   Substance and Sexual Activity  Alcohol Use Yes  . Alcohol/week: 0.6 oz  . Types: 1 Glasses of wine per week      Social History   Tobacco Use  Smoking Status Current Every Day Smoker  . Packs/day: 0.50  . Types: Cigarettes  Smokeless Tobacco Never Used      Health Maintenance  Topic Date Due  . Hepatitis C Screening  [id number]  . HIV Screening  05/19/1969  . TETANUS/TDAP  05/19/1973  . PAP SMEAR  05/20/1975  . MAMMOGRAM  05/19/2004  . COLONOSCOPY  05/19/2004  . INFLUENZA VACCINE  06/01/2017    Assessment and Plan:  1. Compliance: Good; patient manages medications well. She knows when and why she is taking all her medications.   2. Anxiety: Sertraline and clonazepam. Patient states she is most nervous upon waking in the morning and takes her clonazepam when she wakes up. She reports much stress in her life.   3. HTN: Amlodipine and losartan  4. HLD: Lipid panel not available; patient states she is followed by [facility] and they follow her lipids. Currently on garlic and fish oil. Statins cause myalgias.   5. Smoker: Current 0.5 PPD smoker. Has  attempted to quit before with patches. States she is ready to quit but has not set a quit date. Admits to increasing stress more recently and therefore has made it hard for her to think of quitting right now.   Patient has doctor appointment scheduled for tomorrow morning due to back pain/UTI.   Will follow up with MTM appointment at medication management clinic in 1 year or sooner if any questions or concerns arise.   Cleopatra CedarStephanie Ritaj Dullea, PharmD Pharmacy Resident

## 2018-01-02 ENCOUNTER — Telehealth: Payer: Self-pay | Admitting: Pharmacist

## 2018-01-02 NOTE — Telephone Encounter (Signed)
01/02/18 Printed [employer] renewal application for Liberty MediaPro Air HFA 0.09mg  Inhale 2 puffs every 4 hours, mailing patient her portion to sign & return, also mailing provider Deloris PingLeslie Sharpe, FNP [facility] her portion to sign & return.Forde RadonAJ

## 2018-01-24 ENCOUNTER — Telehealth: Payer: Self-pay | Admitting: Pharmacist

## 2018-01-24 NOTE — Telephone Encounter (Signed)
01/24/2018 9:15:29 AM - ProAir HFA renewal  01/24/18 Faxed [employer] application for renewal-ProAir HFA 0.09mg  Inhale 2 puffs every 4 hours.Forde RadonAJ

## 2018-02-06 NOTE — Telephone Encounter (Signed)
11/10/2016 9:07:33 AM - Pro Air refill  Received notice from [employer] for refill on [employer]Pro Air, checked and faxed back to [employer] for processing.

## 2018-08-14 IMAGING — CR DG CHEST 2V
1 series · 2 of 2 positions shown · non-contrast
Comparison: Radiograph December 18, 2014.

CLINICAL DATA: Chest pain.

EXAM:
CHEST  2 VIEW

[Series 1: dg chest 2 view · 0.14mm/px · 2 of 2 slices shown]
[im 1/2]
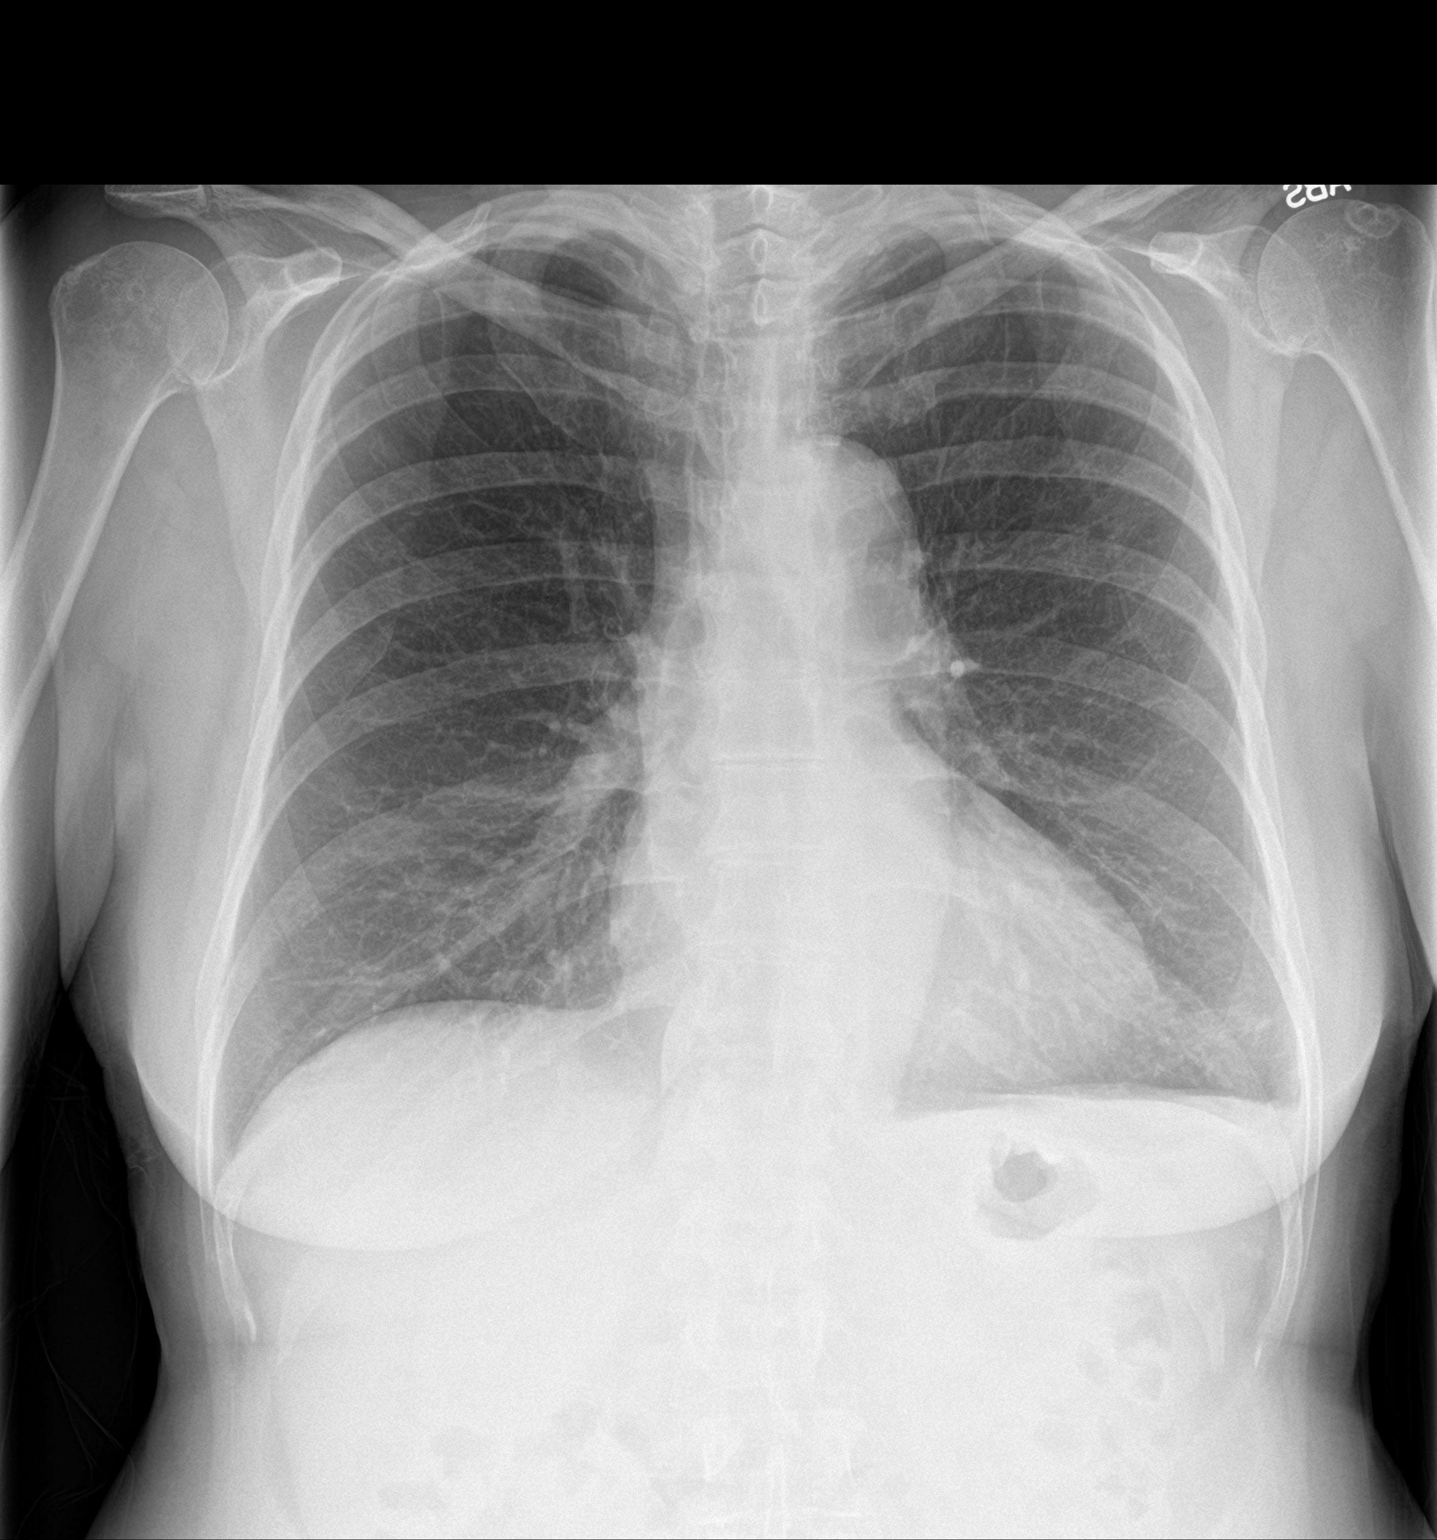
[im 2/2]
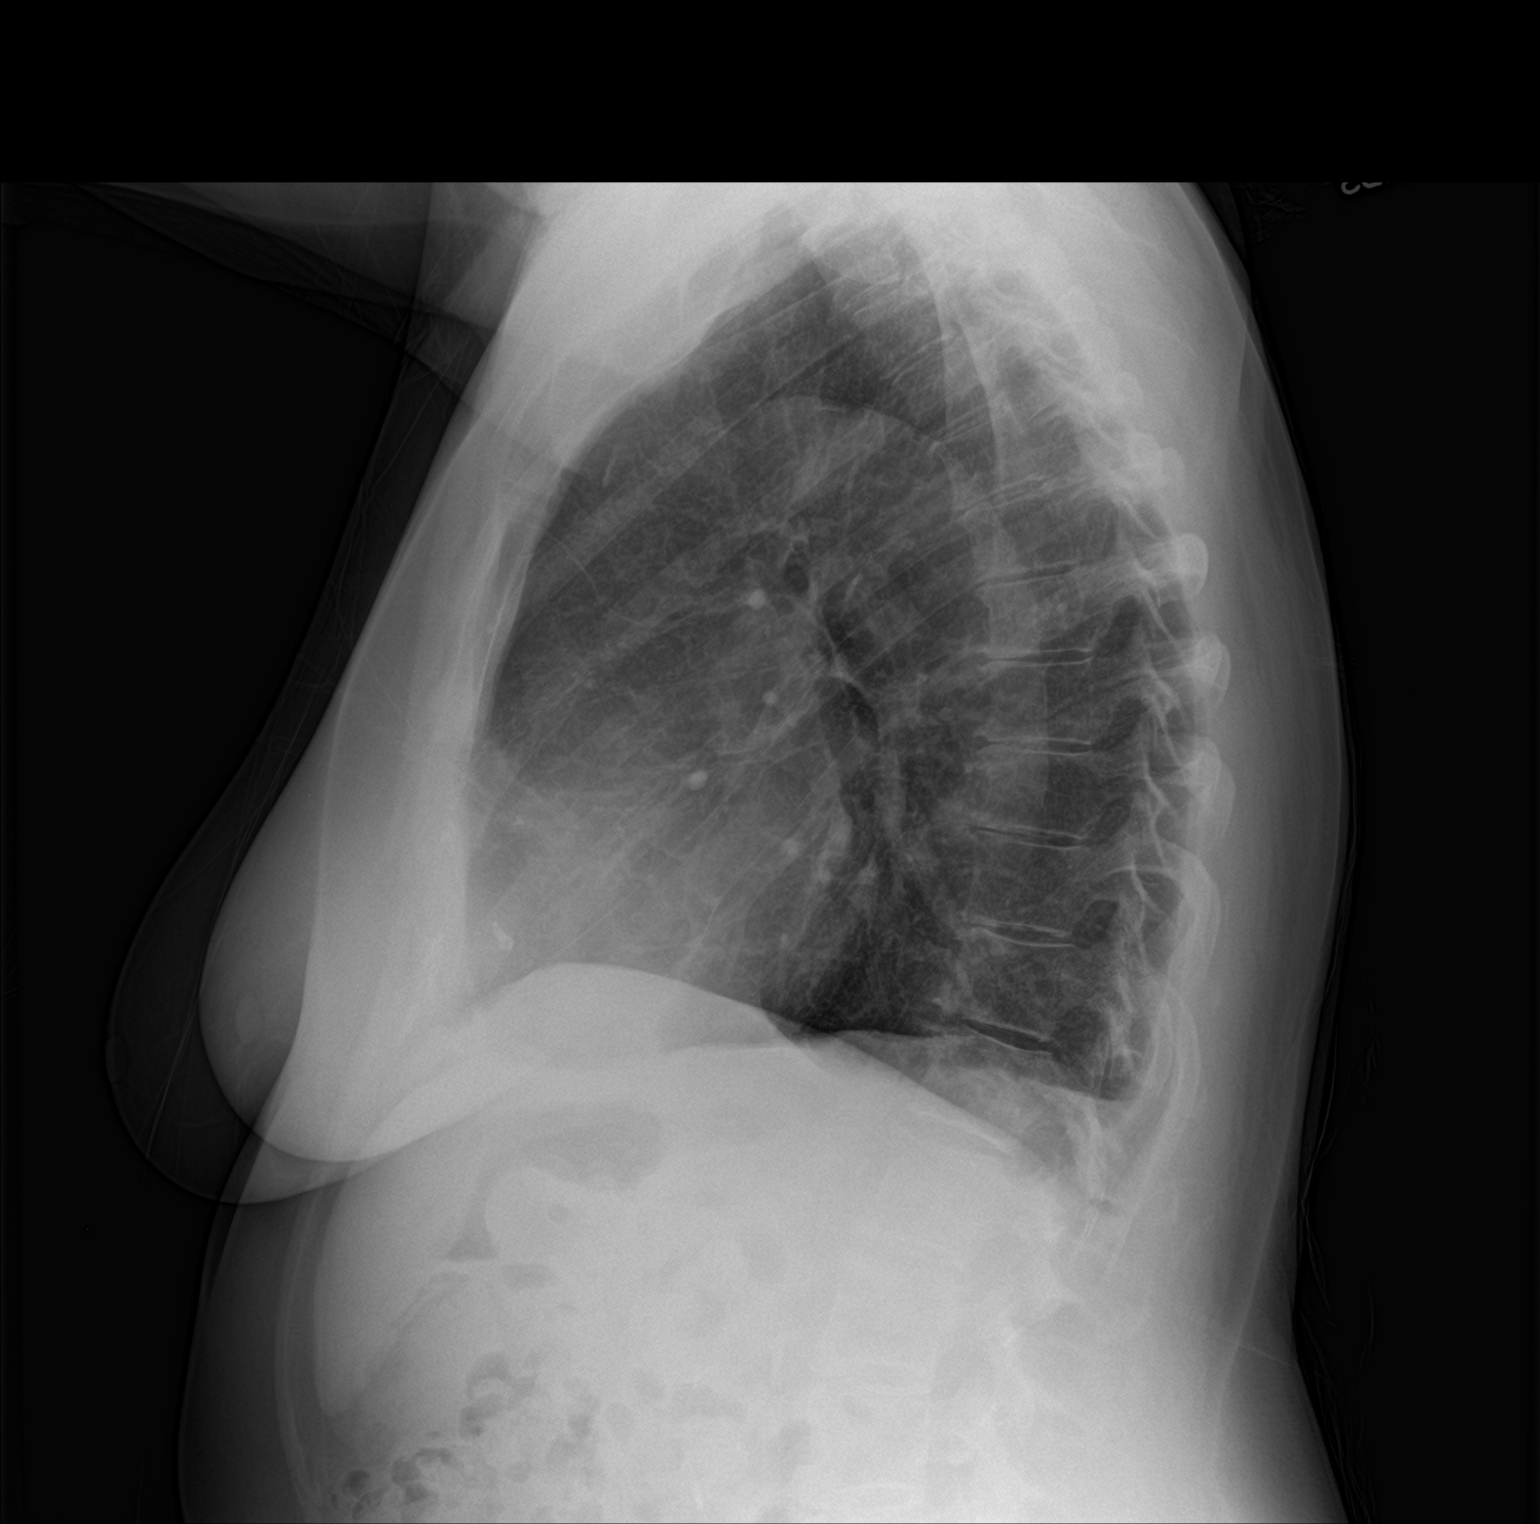

[2 of 2 positions shown; findings below may reference images not displayed]

FINDINGS: The heart size and mediastinal contours are within normal limits. No
pneumothorax is noted. Right lung is clear. Mild bibasilar
subsegmental atelectasis is noted. Minimal left pleural effusion is
noted which is improved compared to prior exam. The visualized
skeletal structures are unremarkable.
IMPRESSION: Mild bibasilar subsegmental atelectasis. Minimal left pleural
effusion which is improved compared to prior exam.

## 2018-12-26 ENCOUNTER — Telehealth: Payer: Self-pay | Admitting: Pharmacy Technician

## 2018-12-26 NOTE — Telephone Encounter (Signed)
Received 2020 proof of income.  Patient eligible to receive medication assistance at [facility] until 05/02/19.  Patient will have Medicare beginning 05/02/19.  Sherilyn Dacosta Care Manager [facility]

## 2018-12-28 ENCOUNTER — Encounter: Payer: Self-pay | Admitting: Pharmacist

## 2019-01-08 ENCOUNTER — Ambulatory Visit: Payer: Self-pay | Admitting: Pharmacist

## 2019-01-08 ENCOUNTER — Encounter (INDEPENDENT_AMBULATORY_CARE_PROVIDER_SITE_OTHER): Payer: Self-pay

## 2019-01-08 ENCOUNTER — Other Ambulatory Visit: Payer: Self-pay

## 2019-01-08 DIAGNOSIS — Z79899 Other long term (current) drug therapy: Secondary | ICD-10-CM

## 2019-01-08 NOTE — Progress Notes (Signed)
[facility] Visit Note  Patient: Jacqueline Perez MRN: [id number] Date of Birth: [birth year] PCP: Pricilla Holm, MD   Jacqueline Perez 63 y.o. female presents for a follow-up visit today.  There were no vitals taken for this visit.  Patient Information   Past Medical History:  Diagnosis Date  . Anxiety   . Hyperlipidemia   . Hypertension   . Rupture of colon [facility])       Past Surgical History:  Procedure Laterality Date  . COLOSTOMY      No family history on file.  New Diagnoses (since last visit): recent UTI          Social History   Substance and Sexual Activity  Alcohol Use Yes  . Alcohol/week: 1.0 standard drinks  . Types: 1 Glasses of wine per week      Social History   Tobacco Use  Smoking Status Current Every Day Smoker  . Packs/day: 0.50  . Types: Cigarettes  Smokeless Tobacco Never Used      Health Maintenance  Topic Date Due  . Hepatitis C Screening  [birth year]  . HIV Screening  05/19/1969  . TETANUS/TDAP  05/19/1973  . PAP SMEAR-Modifier  05/20/1975  . MAMMOGRAM  05/19/2004  . COLONOSCOPY  05/19/2004  . INFLUENZA VACCINE  06/01/2018   Outpatient Encounter Medications as of 01/08/2019  Medication Sig  . albuterol (VENTOLIN HFA) 108 (90 Base) MCG/ACT inhaler Inhale 2 puffs into the lungs every 4 (four) hours as needed for wheezing or shortness of breath.  Marland Kitchen amLODipine (NORVASC) 10 MG tablet Take 10 mg by mouth daily.  Marland Kitchen aspirin EC 81 MG tablet Take 81 mg by mouth daily.  . Cholecalciferol (D3 ADULT PO) Take 400 Units by mouth.  . Cranberry 500 MG CAPS Take 15,000 mg by mouth.  . docusate sodium (COLACE) 100 MG capsule Take 100 mg by mouth daily.  . Garlic 2000 MG CAPS Take 2,000 mg by mouth daily.  Marland Kitchen losartan (COZAAR) 50 MG tablet Take 50 mg by mouth daily.  . Melatonin 1 MG TABS Take 2 mg by mouth at bedtime.  . Multiple Vitamin (MULTIVITAMIN WITH MINERALS) TABS tablet Take 1 tablet by mouth daily.  . Omega-3 Fatty  Acids (FISH OIL) 1200 MG CAPS Take 1,200 mg by mouth daily.  Marland Kitchen omeprazole (PRILOSEC) 20 MG capsule Take 20 mg by mouth daily.  . ondansetron (ZOFRAN) 4 MG tablet Take 4 mg by mouth daily as needed for nausea or vomiting.  . polyethylene glycol (MIRALAX / GLYCOLAX) packet Take 17 g by mouth daily as needed.  . traZODone (DESYREL) 50 MG tablet Take 50 mg by mouth at bedtime.  . Turmeric 450 MG CAPS Take 450 mg by mouth.  . clonazePAM (KLONOPIN) 0.5 MG tablet Take 0.5 mg by mouth daily as needed for anxiety.  . psyllium (METAMUCIL) 58.6 % powder Take 1 packet by mouth 3 (three) times daily.  . sertraline (ZOLOFT) 25 MG tablet Take 25 mg by mouth daily.   No facility-administered encounter medications on file as of 01/08/2019.     Assessment and Plan: Compliance: Patient denies adherence concerns. Of note, she does not like taking medications and prefers herbals and supplements. Patient knows what all of her medications are used for.  Anxiety: Uncontrolled, patient has been taken off of sertraline and clonazepam by her primary care. She wishes she could have something for just as needed, but they want her to have something to take every day rather than  a PRN. She says there are a lot of lifestyle circumstances that stress her out, like her son and her living situation. Patient does state that she will have a glass of wine on occasion to relax her nerves.  HTN (losartan, amlodipine) : Does not check because it makes her anxious, so we will not check today. BP usually around 140s, but this has been discussed with her doctor and is likely due to anxiety.  HLD (garlic, fish oil): No new labs available. Patient cannot tolerate statins due to myalgias.   Smoking cessation: Not ready to quit due to anxiety. Still smoking around 1/2 PPD.  RTC 1 year for annual MTM, but patient will be getting Medicare in July, so likely will not be coming here any longer. The patient is wanting to speak with Kathie Rhodes as the  date gets closer so she knows what she needs to do with her medications.   Alyson Ingles, PharmD Candidate [facility]

## 2021-07-22 ENCOUNTER — Other Ambulatory Visit: Payer: Self-pay

## 2021-07-22 ENCOUNTER — Encounter: Payer: Self-pay | Admitting: Dermatology

## 2021-07-22 ENCOUNTER — Ambulatory Visit: Payer: Medicare Other | Admitting: Dermatology

## 2021-07-22 DIAGNOSIS — L820 Inflamed seborrheic keratosis: Secondary | ICD-10-CM | POA: Diagnosis not present

## 2021-07-22 DIAGNOSIS — I872 Venous insufficiency (chronic) (peripheral): Secondary | ICD-10-CM | POA: Diagnosis not present

## 2021-07-22 NOTE — Progress Notes (Signed)
   New Patient Visit  Subjective  Jacqueline Perez is a 65 y.o. female who presents for the following: Skin Problem (Fell ~5 months ago. Laceration on left knee, cleaned up and applied neosporin. Noticed pink patchy rash not long after injury on both legs, itched at times. Left foot and ankle have been swollen for the past 3 months. C/O "painful hot itch" at times. Concerned with cellulitis. PCP started Keflex 500mg  TID Monday, has had 7 doses. Recent labs WNL. Right leg beginning to swell recently.  ).  Referred by Saturday, PA.  Objective  Well appearing patient in no apparent distress; mood and affect are within normal limits.  Review of Systems: No other skin or systemic complaints except as noted in HPI or Assessment and Plan.   bilateral lower legs Erythematous keratotic or waxy stuck-on papules.  Left Ankle, left foot pitting edema at ankle and foot dorsum. L > R. with dusky pink brown discoloration. Peau d'orange textural change with skin induration at left lateral foot/ankle.  No evidence of cellulitis  Assessment & Plan  Inflamed seborrheic keratosis bilateral lower legs  Recommend to hold cryotherapy today due to swelling in legs- risk poor wound healing. If spots itchy or symptomatic, apply topical steroid cream (HC, TMC) prn itch and consider cryotherapy once swelling is improved  Chronic stasis dermatitis of left lower extremity Left Ankle, left foot  Doubt cellulitis.  Stasis in the legs causes chronic leg swelling, which may result in itchy or painful rashes, skin discoloration, skin texture changes, and sometimes ulceration.  Recommend daily compression hose/stockings- easiest to put on first thing in morning, remove at bedtime.  Elevate legs as much as possible. Avoid salt/sodium rich foods.   Advised this can be a chronic condition related to venous stasis or could be related to the prior fall/injury to left leg, since she noticed it after that.  Recommend  wearing compression stockings daily. Apply QAM.  Okay to use OTC HC cream or Triamcinolone cream prescribed by PCP qd/bid prn itchy rash. Avoid using TMC longer than 4 weeks due to risk skin atrophy. Can finish Keflex as prescribed.  Recommend mild soap and moisturizing cream 1-2 times daily.  Recommend f/u with PCP for evaluation and treatment of leg swelling, consider adding oral diuretic medication in addition to daily compression if not improving.  Topical steroids (such as triamcinolone, fluocinolone, fluocinonide, mometasone, clobetasol, halobetasol, betamethasone, hydrocortisone) can cause thinning and lightening of the skin if they are used for too long in the same area. Your physician has selected the right strength medicine for your problem and area affected on the body. Please use your medication only as directed by your physician to prevent side effects.        Return if symptoms worsen or fail to improve.  I, Unknown Foley, CMA, am acting as scribe for Lawson Radar, MD.  Documentation: I have reviewed the above documentation for accuracy and completeness, and I agree with the above.  Willeen Niece MD

## 2021-07-22 NOTE — Patient Instructions (Addendum)
Recommend compression stockings 15-20 mmhg. [facility] will measure.  Discuss diuretic medication with PCP.  Topical steroids (such as triamcinolone, fluocinolone, fluocinonide, mometasone, clobetasol, halobetasol, betamethasone, hydrocortisone) can cause thinning and lightening of the skin if they are used for too long in the same area. Your physician has selected the right strength medicine for your problem and area affected on the body. Please use your medication only as directed by your physician to prevent side effects.    If you have any questions or concerns for your doctor, please call our main line at [phone] and press option 4 to reach your doctor's medical assistant. If no one answers, please leave a voicemail as directed and we will return your call as soon as possible. Messages left after 4 pm will be answered the following business day.   You may also send [place] a message via MyChart. We typically respond to MyChart messages within 1-2 business days.  For prescription refills, please ask your pharmacy to contact our office. Our fax number is [phone].  If you have an urgent issue when the clinic is closed that cannot wait until the next business day, you can page your doctor at the number below.    Please note that while we do our best to be available for urgent issues outside of office hours, we are not available 24/7.   If you have an urgent issue and are unable to reach [place], you may choose to seek medical care at your doctor's office, retail clinic, urgent care center, or emergency room.  If you have a medical emergency, please immediately call 911 or go to the emergency department.  Pager Numbers  - Dr. Gwen Pounds: [phone]  - Dr. Neale Burly: [phone]  - Dr. Roseanne Reno: [phone]  In the event of inclement weather, please call our main line at [phone] for an update on the status of any delays or closures.  Dermatology Medication Tips: Please keep  the boxes that topical medications come in in order to help keep track of the instructions about where and how to use these. Pharmacies typically print the medication instructions only on the boxes and not directly on the medication tubes.   If your medication is too expensive, please contact our office at [phone] option 4 or send [place] a message through MyChart.   We are unable to tell what your co-pay for medications will be in advance as this is different depending on your insurance coverage. However, we may be able to find a substitute medication at lower cost or fill out paperwork to get insurance to cover a needed medication.   If a prior authorization is required to get your medication covered by your insurance company, please allow [place] 1-2 business days to complete this process.  Drug prices often vary depending on where the prescription is filled and some pharmacies may offer cheaper prices.  The website www.goodrx.com contains coupons for medications through different pharmacies. The prices here do not account for what the cost may be with help from insurance (it may be cheaper with your insurance), but the website can give you the price if you did not use any insurance.  - You can print the associated coupon and take it with your prescription to the pharmacy.  - You may also stop by our office during regular business hours and pick up a GoodRx coupon card.  - If you need your prescription sent electronically to a different pharmacy, notify our office through [facility] or  by phone at [phone] option 4.

## 2022-06-19 ENCOUNTER — Emergency Department: Payer: Medicare Other

## 2022-06-19 ENCOUNTER — Other Ambulatory Visit: Payer: Self-pay

## 2022-06-19 ENCOUNTER — Emergency Department
Admission: EM | Admit: 2022-06-19 | Discharge: 2022-06-19 | Disposition: A | Discharge status: Home / Self Care | Payer: Medicare Other | Attending: Emergency Medicine | Admitting: Emergency Medicine

## 2022-06-19 DIAGNOSIS — W1841XA Slipping, tripping and stumbling without falling due to stepping on object, initial encounter: Secondary | ICD-10-CM | POA: Insufficient documentation

## 2022-06-19 DIAGNOSIS — H6501 Acute serous otitis media, right ear: Secondary | ICD-10-CM | POA: Diagnosis not present

## 2022-06-19 DIAGNOSIS — J0100 Acute maxillary sinusitis, unspecified: Secondary | ICD-10-CM | POA: Insufficient documentation

## 2022-06-19 DIAGNOSIS — S99922A Unspecified injury of left foot, initial encounter: Secondary | ICD-10-CM | POA: Diagnosis present

## 2022-06-19 DIAGNOSIS — I10 Essential (primary) hypertension: Secondary | ICD-10-CM | POA: Insufficient documentation

## 2022-06-19 DIAGNOSIS — J439 Emphysema, unspecified: Secondary | ICD-10-CM | POA: Diagnosis not present

## 2022-06-19 DIAGNOSIS — H6591 Unspecified nonsuppurative otitis media, right ear: Secondary | ICD-10-CM

## 2022-06-19 DIAGNOSIS — Y92008 Other place in unspecified non-institutional (private) residence as the place of occurrence of the external cause: Secondary | ICD-10-CM | POA: Diagnosis not present

## 2022-06-19 MED ORDER — HYDROCODONE-ACETAMINOPHEN 5-325 MG PO TABS: 1.0000 | ORAL_TABLET | ORAL | 0 refills | Status: AC | PRN

## 2022-06-19 MED ORDER — FLUCONAZOLE 150 MG PO TABS: 150.0000 mg | ORAL_TABLET | ORAL | 0 refills | Status: AC

## 2022-06-19 MED ORDER — ACETAMINOPHEN 500 MG PO TABS
1000.0000 mg | ORAL_TABLET | Freq: Once | ORAL | Status: AC
  Administered 2022-06-19: 1000 mg via ORAL
  Filled 2022-06-19: qty 2

## 2022-06-19 MED ORDER — IBUPROFEN 400 MG PO TABS
400.0000 mg | ORAL_TABLET | Freq: Once | ORAL | Status: AC
  Administered 2022-06-19: 400 mg via ORAL
  Filled 2022-06-19: qty 1

## 2022-06-19 MED ORDER — FLUCONAZOLE 150 MG PO TABS: ORAL_TABLET | ORAL | 0 refills | Status: DC

## 2022-06-19 MED ORDER — AMOXICILLIN-POT CLAVULANATE 875-125 MG PO TABS: 1.0000 | ORAL_TABLET | Freq: Two times a day (BID) | ORAL | 0 refills | Status: AC

## 2022-06-19 MED ORDER — IBUPROFEN 600 MG PO TABS: 600.0000 mg | ORAL_TABLET | Freq: Four times a day (QID) | ORAL | 0 refills | Status: AC | PRN

## 2022-06-19 NOTE — ED Notes (Signed)
Patient declined discharge vital signs. 

## 2022-06-19 NOTE — ED Provider Notes (Signed)
[facility] Provider Note    Event Date/Time   First MD Initiated Contact with Patient 06/19/22 1513     (approximate)   History   Chief Complaint Foot Pain   HPI Jacqueline Perez is a 66 y.o. female, history of hypertension, emphysema, anxiety, presents to the emergency department for evaluation of left-sided foot pain.  She states she felt off balance yesterday, which she attributes to a sinus/ear infection that she is currently dealing with, and accidentally tripped on her porch and injured the top of her left foot.  She states that she is still been able to walk, though with significant difficulty.  Denies ankle pain, leg pain, knee pain, head injury, chest pain, shortness of breath, abdominal pain, flank pain, nausea/vomiting, urinary symptoms, vision changes, vertigo, or lightheadedness.  History Limitations: No limitations.        Physical Exam  Triage Vital Signs: ED Triage Vitals  Enc Vitals Group     BP 06/19/22 1500 106/66     Pulse Rate 06/19/22 1500 99     Resp 06/19/22 1500 18     Temp 06/19/22 1500 98.6 F (37 C)     Temp Source 06/19/22 1500 Oral     SpO2 06/19/22 1500 99 %     Weight 06/19/22 1458 110 lb (49.9 kg)     Height 06/19/22 1458 5\' 2"  (1.575 m)     Head Circumference --      Peak Flow --      Pain Score 06/19/22 1457 10     Pain Loc --      Pain Edu? --      Excl. in GC? --     Most recent vital signs: Vitals:   06/19/22 1500  BP: 106/66  Pulse: 99  Resp: 18  Temp: 98.6 F (37 C)  SpO2: 99%    General: Awake, NAD.  Audible cough/congestion noted. Skin: Warm, dry. No rashes or lesions.  Eyes: PERRL. Conjunctivae normal.  CV: Good peripheral perfusion.  Resp: Normal effort.  Abd: Soft, non-tender. No distention.  Neuro: At baseline. No gross neurological deficits.   Focused Exam: Left TM has mild bulging present.  No erythema.  Right TM shows mild bulging with what appears to be a small TM rupture at the  1 o'clock position with small amount of bleeding.   Diffuse swelling along the metatarsals on the left side, with tenderness along the fifth metatarsal.  PMS intact distally.  No bony tenderness along the ankle or tibia/fibula.  She is still able to stand on her own.  Maxillary sinus tenderness appreciated bilaterally.  Physical Exam    ED Results / Procedures / Treatments  Labs (all labs ordered are listed, but only abnormal results are displayed) Labs Reviewed - No data to display   EKG N/A.   RADIOLOGY  ED Provider Interpretation: I personally viewed and interpreted this x-ray, no obvious fractures or dislocations.  DG Foot Complete Left  Result Date: 06/19/2022 CLINICAL DATA:  Foot swelling after a fall EXAM: LEFT FOOT - COMPLETE 3+ VIEW COMPARISON:  None Available. FINDINGS: Osteopenia.  Dorsal soft tissue swelling about the midfoot. Especially on the oblique images, there is osseous irregularity about the base of the third through fifth metatarsals. IMPRESSION: Soft tissue swelling with subtle osseous irregularity about the third through fifth metatarsal bases. Correlate with point tenderness. Consider CT to confirm subtle fractures. Electronically Signed   By: 06/21/2022 M.D.   On: 06/19/2022  15:36    PROCEDURES:  Critical Care performed: N/A.  Procedures    MEDICATIONS ORDERED IN ED: Medications  acetaminophen (TYLENOL) tablet 1,000 mg (1,000 mg Oral Given 06/19/22 1614)  ibuprofen (ADVIL) tablet 400 mg (400 mg Oral Given 06/19/22 1614)     IMPRESSION / MDM / ASSESSMENT AND PLAN / ED COURSE  I reviewed the triage vital signs and the nursing notes.                              Differential diagnosis includes, but is not limited to, metatarsal fracture, foot/ankle sprain, cellulitis, otitis media, sinusitis  Assessment/Plan Patient presents with left foot injury following mechanical fall yesterday.  X-rays not show any obvious fractures, does show signs of  possible subtle fractures.  Given the history and physical exam, I suspect that the patient likely has minor or stress fractures in the third through fifth metatarsals.  Otherwise appears stable.  We will provide her with a cam boot and a referral to orthopedics to follow-up with.  We will additionally provide her with a short course of hydrocodone/acetaminophen.  In regards to her reported dizziness, she does have evidence of sinusitis with effusions behind the TMs, including a small rupture from the right TM.  Will prescribe her short course of Augmentin for her sinusitis and encouraged her to take pseudoephedrine that she has at home.  Will discharge.  In addition provided patient with prophylactic fluconazole, as requested.  Provided the patient with anticipatory guidance, return precautions, and educational material. Encouraged the patient to return to the emergency department at any time if they begin to experience any new or worsening symptoms. Patient expressed understanding and agreed with the plan.   Patient's presentation is most consistent with acute complicated illness / injury requiring diagnostic workup.       FINAL CLINICAL IMPRESSION(S) / ED DIAGNOSES   Final diagnoses:  Foot injury, left, initial encounter  Acute non-recurrent maxillary sinusitis  Otitis media, serous, tm rupture, right     Rx / DC Orders   ED Discharge Orders          Ordered    amoxicillin-clavulanate (AUGMENTIN) 875-125 MG tablet  2 times daily        06/19/22 1552    HYDROcodone-acetaminophen (NORCO/VICODIN) 5-325 MG tablet  Every 4 hours PRN        06/19/22 1552    ibuprofen (ADVIL) 600 MG tablet  Every 6 hours PRN        06/19/22 1554    fluconazole (DIFLUCAN) 150 MG tablet  Status:  Discontinued        06/19/22 1705    fluconazole (DIFLUCAN) 150 MG tablet  Weekly       Note to Pharmacy: Take 1 tablet weekly for 6 weeks   06/19/22 1723             Note:  This document was prepared  using Dragon voice recognition software and may include unintentional dictation errors.   Varney Daily, [place] 06/19/22 8299    Arnaldo Natal, MD 06/20/22 [phone]

## 2022-06-19 NOTE — ED Triage Notes (Signed)
Pt states that she tripped on her porch yesterday and hurt her L foot- foot is swollen, no redness noted- pt states that she has been having pain in her R ear d/t a sinus infection and she thinks it has affected her balance

## 2022-06-19 NOTE — Discharge Instructions (Addendum)
-  You may take ibuprofen as needed for pain.  Use hydrocodone/acetaminophen sparingly as it can cause several side effects.  -Take all of your Augmentin (amoxicillin/clavulanic )as prescribed.  -Call the orthopedist listed in these instructions and schedule an appointment.  -Return to the emergency department anytime if you begin to experience any new or worsening symptoms.

## 2022-08-02 ENCOUNTER — Other Ambulatory Visit: Payer: Self-pay

## 2022-08-02 ENCOUNTER — Emergency Department
Admission: EM | Admit: 2022-08-02 | Discharge: 2022-08-03 | Disposition: A | Discharge status: Home / Self Care | Payer: No Typology Code available for payment source | Attending: Emergency Medicine | Admitting: Emergency Medicine

## 2022-08-02 ENCOUNTER — Emergency Department: Payer: No Typology Code available for payment source

## 2022-08-02 DIAGNOSIS — J439 Emphysema, unspecified: Secondary | ICD-10-CM | POA: Insufficient documentation

## 2022-08-02 DIAGNOSIS — K429 Umbilical hernia without obstruction or gangrene: Secondary | ICD-10-CM | POA: Diagnosis not present

## 2022-08-02 DIAGNOSIS — I6789 Other cerebrovascular disease: Secondary | ICD-10-CM | POA: Insufficient documentation

## 2022-08-02 DIAGNOSIS — S62317A Displaced fracture of base of fifth metacarpal bone. left hand, initial encounter for closed fracture: Secondary | ICD-10-CM | POA: Diagnosis not present

## 2022-08-02 DIAGNOSIS — S52502A Unspecified fracture of the lower end of left radius, initial encounter for closed fracture: Secondary | ICD-10-CM | POA: Diagnosis not present

## 2022-08-02 DIAGNOSIS — S20219A Contusion of unspecified front wall of thorax, initial encounter: Secondary | ICD-10-CM | POA: Insufficient documentation

## 2022-08-02 DIAGNOSIS — Y92410 Unspecified street and highway as the place of occurrence of the external cause: Secondary | ICD-10-CM | POA: Diagnosis not present

## 2022-08-02 DIAGNOSIS — S59912A Unspecified injury of left forearm, initial encounter: Secondary | ICD-10-CM | POA: Diagnosis present

## 2022-08-02 DIAGNOSIS — S62307A Unspecified fracture of fifth metacarpal bone, left hand, initial encounter for closed fracture: Secondary | ICD-10-CM
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
## 2022-08-03 ENCOUNTER — Emergency Department: Payer: No Typology Code available for payment source

## 2022-08-03 DIAGNOSIS — S52502A Unspecified fracture of the lower end of left radius, initial encounter for closed fracture: Secondary | ICD-10-CM | POA: Diagnosis not present

## 2022-08-03 LAB — ETHANOL: Alcohol, Ethyl (B): 147 mg/dL — ABNORMAL HIGH (ref <10)

## 2022-08-03 LAB — TROPONIN I (HIGH SENSITIVITY)
Troponin I (High Sensitivity): 14 ng/L (ref <18)
Troponin I (High Sensitivity): 16 ng/L (ref <18)

## 2022-08-03 LAB — CBC WITH DIFFERENTIAL/PLATELET
Abs Immature Granulocytes: 0.03 10*3/uL (ref 0.00–0.07)
Basophils Absolute: 0 10*3/uL (ref 0.0–0.1)
Basophils Relative: 0 %
Eosinophils Absolute: 0 10*3/uL (ref 0.0–0.5)
Eosinophils Relative: 0 %
HCT: 34.4 % — ABNORMAL LOW (ref 36.0–46.0)
Hemoglobin: 11.8 g/dL — ABNORMAL LOW (ref 12.0–15.0)
Immature Granulocytes: 0 %
Lymphocytes Relative: 15 %
Lymphs Abs: 1.4 10*3/uL (ref 0.7–4.0)
MCH: 34.1 pg — ABNORMAL HIGH (ref 26.0–34.0)
MCHC: 34.3 g/dL (ref 30.0–36.0)
MCV: 99.4 fL (ref 80.0–100.0)
Monocytes Absolute: 0.5 10*3/uL (ref 0.1–1.0)
Monocytes Relative: 5 %
Neutro Abs: 7.3 10*3/uL (ref 1.7–7.7)
Neutrophils Relative %: 80 %
Platelets: 268 10*3/uL (ref 150–400)
RBC: 3.46 MIL/uL — ABNORMAL LOW (ref 3.87–5.11)
RDW: 14.1 % (ref 11.5–15.5)
WBC: 9.2 10*3/uL (ref 4.0–10.5)
nRBC: 0 % (ref 0.0–0.2)

## 2022-08-03 LAB — COMPREHENSIVE METABOLIC PANEL
ALT: 21 U/L (ref 0–44)
AST: 39 U/L (ref 15–41)
Albumin: 3.9 g/dL (ref 3.5–5.0)
Alkaline Phosphatase: 68 U/L (ref 38–126)
Anion gap: 12 (ref 5–15)
BUN: 12 mg/dL (ref 8–23)
CO2: 23 mmol/L (ref 22–32)
Calcium: 9.3 mg/dL (ref 8.9–10.3)
Chloride: 99 mmol/L (ref 98–111)
Creatinine, Ser: 0.71 mg/dL (ref 0.44–1.00)
GFR, Estimated: >60 mL/min (ref >60)
Glucose, Bld: 95 mg/dL (ref 70–99)
Potassium: 3.7 mmol/L (ref 3.5–5.1)
Sodium: 134 mmol/L — ABNORMAL LOW (ref 135–145)
Total Bilirubin: 0.6 mg/dL (ref 0.3–1.2)
Total Protein: 7.2 g/dL (ref 6.5–8.1)

## 2022-08-03 MED ORDER — OXYCODONE-ACETAMINOPHEN 5-325 MG PO TABS
1.0000 | ORAL_TABLET | Freq: Once | ORAL | Status: AC
  Administered 2022-08-03: 1 via ORAL
  Filled 2022-08-03: qty 1

## 2022-08-03 MED ORDER — ACETAMINOPHEN 500 MG PO TABS
1000.0000 mg | ORAL_TABLET | Freq: Once | ORAL | Status: AC
  Administered 2022-08-03: 1000 mg via ORAL
  Filled 2022-08-03: qty 2

## 2022-08-03 MED ORDER — HYDROMORPHONE HCL 1 MG/ML IJ SOLN
0.5000 mg | Freq: Once | INTRAMUSCULAR | Status: AC
  Administered 2022-08-03: 0.5 mg via INTRAVENOUS
  Filled 2022-08-03: qty 0.5

## 2022-08-03 MED ORDER — IOHEXOL 300 MG/ML  SOLN
100.0000 mL | Freq: Once | INTRAMUSCULAR | Status: AC | PRN
  Administered 2022-08-03: 100 mL via INTRAVENOUS

## 2022-08-03 MED ORDER — OXYCODONE-ACETAMINOPHEN 5-325 MG PO TABS: 1.0000 | ORAL_TABLET | ORAL | 0 refills | Status: AC | PRN

## 2022-08-03 NOTE — ED Notes (Signed)
Provided pt with water to drink per Dr. Cherylann Banas approval

## 2022-08-03 NOTE — ED Notes (Signed)
EDT at bedside to splint pt's left arm

## 2022-08-03 NOTE — ED Notes (Signed)
Pt returned from CT °

## 2022-08-03 NOTE — ED Notes (Signed)
Pt states she does not have a ride home, charge RN aware and to give cab voucher to pt.

## 2022-08-03 NOTE — ED Notes (Signed)
Patient transported to CT 

## 2022-08-03 NOTE — Discharge Instructions (Addendum)
Keep the splint on at all times until you follow-up.  We have given you referral to see a hand orthopedic specialist.  You should call to make an appointment within the next week.  Tell them you were seen in the emergency department.  Your scans are also showing an injury to your sternum (breastbone) which is like a contusion or deep bruise.  Use your incentive spirometer to make sure you are taking deep breaths several times per day.  You should also follow-up with your primary care doctor.  The CT of your head shows a lesion in your left parietal lobe which needs an MRI with and without contrast for further evaluation.  You should follow-up with your primary care doctor to have this ordered as an outpatient.  Return to the ER for new, worsening, or persistent severe chest, arm, or other pain, difficulty breathing, fever, weakness or lightheadedness, numbness or weakness in the arm, or any other new or worsening symptoms that concern you.

## 2022-08-03 NOTE — ED Notes (Signed)
Provided pt with ice pack for left hand

## 2022-08-13 ENCOUNTER — Other Ambulatory Visit: Payer: Self-pay

## 2022-08-13 ENCOUNTER — Emergency Department: Payer: Medicare Other

## 2022-08-13 ENCOUNTER — Emergency Department
Admission: EM | Admit: 2022-08-13 | Discharge: 2022-08-13 | Disposition: A | Discharge status: Home / Self Care | Payer: Medicare Other | Attending: Emergency Medicine | Admitting: Emergency Medicine

## 2022-08-13 ENCOUNTER — Encounter: Payer: Self-pay | Admitting: Emergency Medicine

## 2022-08-13 DIAGNOSIS — R079 Chest pain, unspecified: Secondary | ICD-10-CM | POA: Diagnosis present

## 2022-08-13 DIAGNOSIS — I10 Essential (primary) hypertension: Secondary | ICD-10-CM | POA: Diagnosis not present

## 2022-08-13 DIAGNOSIS — J449 Chronic obstructive pulmonary disease, unspecified: Secondary | ICD-10-CM | POA: Diagnosis not present

## 2022-08-13 DIAGNOSIS — F17200 Nicotine dependence, unspecified, uncomplicated: Secondary | ICD-10-CM | POA: Diagnosis not present

## 2022-08-13 DIAGNOSIS — R091 Pleurisy: Secondary | ICD-10-CM | POA: Diagnosis not present

## 2022-08-13 DIAGNOSIS — N3000 Acute cystitis without hematuria: Secondary | ICD-10-CM | POA: Diagnosis not present

## 2022-08-13 LAB — URINALYSIS, ROUTINE W REFLEX MICROSCOPIC
Bilirubin Urine: NEGATIVE
Glucose, UA: NEGATIVE mg/dL
Hgb urine dipstick: NEGATIVE
Ketones, ur: NEGATIVE mg/dL
Nitrite: NEGATIVE
Protein, ur: NEGATIVE mg/dL
Specific Gravity, Urine: 1.013 (ref 1.005–1.030)
WBC, UA: >50 WBC/hpf — ABNORMAL HIGH (ref 0–5)
pH: 5 (ref 5.0–8.0)

## 2022-08-13 LAB — COMPREHENSIVE METABOLIC PANEL
ALT: 9 U/L (ref 0–44)
AST: 16 U/L (ref 15–41)
Albumin: 3.1 g/dL — ABNORMAL LOW (ref 3.5–5.0)
Alkaline Phosphatase: 57 U/L (ref 38–126)
Anion gap: 11 (ref 5–15)
BUN: 9 mg/dL (ref 8–23)
CO2: 23 mmol/L (ref 22–32)
Calcium: 8.7 mg/dL — ABNORMAL LOW (ref 8.9–10.3)
Chloride: 97 mmol/L — ABNORMAL LOW (ref 98–111)
Creatinine, Ser: 0.68 mg/dL (ref 0.44–1.00)
GFR, Estimated: >60 mL/min (ref >60)
Glucose, Bld: 100 mg/dL — ABNORMAL HIGH (ref 70–99)
Potassium: 3.3 mmol/L — ABNORMAL LOW (ref 3.5–5.1)
Sodium: 131 mmol/L — ABNORMAL LOW (ref 135–145)
Total Bilirubin: 0.6 mg/dL (ref 0.3–1.2)
Total Protein: 6.8 g/dL (ref 6.5–8.1)

## 2022-08-13 LAB — CBC WITH DIFFERENTIAL/PLATELET
Abs Immature Granulocytes: 0.19 10*3/uL — ABNORMAL HIGH (ref 0.00–0.07)
Basophils Absolute: 0.1 10*3/uL (ref 0.0–0.1)
Basophils Relative: 1 %
Eosinophils Absolute: 0 10*3/uL (ref 0.0–0.5)
Eosinophils Relative: 0 %
HCT: 31 % — ABNORMAL LOW (ref 36.0–46.0)
Hemoglobin: 10.4 g/dL — ABNORMAL LOW (ref 12.0–15.0)
Immature Granulocytes: 2 %
Lymphocytes Relative: 17 %
Lymphs Abs: 1.8 10*3/uL (ref 0.7–4.0)
MCH: 33.3 pg (ref 26.0–34.0)
MCHC: 33.5 g/dL (ref 30.0–36.0)
MCV: 99.4 fL (ref 80.0–100.0)
Monocytes Absolute: 1.1 10*3/uL — ABNORMAL HIGH (ref 0.1–1.0)
Monocytes Relative: 10 %
Neutro Abs: 7.5 10*3/uL (ref 1.7–7.7)
Neutrophils Relative %: 70 %
Platelets: 599 10*3/uL — ABNORMAL HIGH (ref 150–400)
RBC: 3.12 MIL/uL — ABNORMAL LOW (ref 3.87–5.11)
RDW: 14.2 % (ref 11.5–15.5)
WBC: 10.7 10*3/uL — ABNORMAL HIGH (ref 4.0–10.5)
nRBC: 0 % (ref 0.0–0.2)

## 2022-08-13 LAB — TROPONIN I (HIGH SENSITIVITY): Troponin I (High Sensitivity): 5 ng/L (ref <18)

## 2022-08-13 MED ORDER — HYDROCODONE-ACETAMINOPHEN 5-325 MG PO TABS: 1.0000 | ORAL_TABLET | Freq: Four times a day (QID) | ORAL | 0 refills | Status: AC | PRN

## 2022-08-13 MED ORDER — HYDROCODONE-ACETAMINOPHEN 5-325 MG PO TABS
1.0000 | ORAL_TABLET | Freq: Once | ORAL | Status: AC
  Administered 2022-08-13: 1 via ORAL
  Filled 2022-08-13: qty 1

## 2022-08-13 MED ORDER — CEPHALEXIN 500 MG PO CAPS: 500.0000 mg | ORAL_CAPSULE | Freq: Two times a day (BID) | ORAL | 0 refills | Status: AC
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
## 2022-08-13 NOTE — Discharge Instructions (Addendum)
You are seen in the emergency department, your chest x-ray did not show any signs of pneumonia.  Your urine was concerning for urinary tract infection.  You are given a prescription for antibiotics. Concern for pleurisy for your chest pain, start taking anti-inflammatory medications.  Stop smoking :)   Pain control:  Ibuprofen (motrin/aleve/advil) - You can take 3-4 tablets (600-800 mg) every 6 hours as needed for pain/fever.  Acetaminophen (tylenol) - You can take 2 extra strength tablets (1000 mg) every 6 hours as needed for pain/fever.  You can alternate these medications or take them together.  Make sure you eat food/drink water when taking these medications.  Your potassium was mildly low when checked today.  Make sure to follow up with a primary doctor to follow up your labs.  Make sure to eat food high in potassium and magnesium - examples - potatoes, spinach, bananas, beans, avocadoes, oranges, nuts.
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
## 2022-08-15 LAB — URINE CULTURE: Culture: 40000 — AB

## 2022-12-27 ENCOUNTER — Other Ambulatory Visit: Payer: Self-pay

## 2022-12-27 ENCOUNTER — Ambulatory Visit
Admission: EM | Admit: 2022-12-27 | Discharge: 2022-12-27 | Disposition: A | Discharge status: Home / Self Care | Payer: 59 | Attending: Urgent Care | Admitting: Urgent Care

## 2022-12-27 DIAGNOSIS — N3001 Acute cystitis with hematuria: Secondary | ICD-10-CM | POA: Diagnosis not present

## 2022-12-27 LAB — POCT URINALYSIS DIP (MANUAL ENTRY)
Glucose, UA: NEGATIVE mg/dL
Nitrite, UA: NEGATIVE
Protein Ur, POC: >=300 mg/dL — AB
Spec Grav, UA: 1.025 (ref 1.010–1.025)
Urobilinogen, UA: 2 E.U./dL — AB
pH, UA: 6.5 (ref 5.0–8.0)

## 2022-12-27 MED ORDER — CIPROFLOXACIN HCL 500 MG PO TABS: 500.0000 mg | ORAL_TABLET | Freq: Two times a day (BID) | ORAL | 0 refills | Status: AC

## 2022-12-27 MED ORDER — NITROFURANTOIN MONOHYD MACRO 100 MG PO CAPS
100.0000 mg | ORAL_CAPSULE | Freq: Two times a day (BID) | ORAL | 0 refills | Status: DC
  Filled 2022-12-27: qty 10, 5d supply, fill #0

## 2022-12-27 NOTE — ED Provider Notes (Signed)
[facility]    CSN: [id number] Arrival date & time: 12/27/22  1037      History   Chief Complaint No chief complaint on file.   HPI Jacqueline Perez is a 67 y.o. female.   HPI  Patient presents to urgent care with symptoms of dysuria, hematuria, urgency x 2 days.  Endorses history of UTIs.  She reports chills but no fever or bodyaches.  Past Medical History:  Diagnosis Date   Anxiety    Hyperlipidemia    Hypertension    Rupture of colon [facility])     Patient Active Problem List   Diagnosis Date Noted   Encounter for medication management 12/26/2017   Hypertension 12/24/2014   Emphysema of lung ([place]) 12/24/2014   S/P colon resection 12/24/2014    Past Surgical History:  Procedure Laterality Date   COLOSTOMY      OB History   No obstetric history on file.      Home Medications    Prior to Admission medications   Medication Sig Start Date End Date Taking? Authorizing Provider  albuterol (VENTOLIN HFA) 108 (90 Base) MCG/ACT inhaler Inhale 2 puffs into the lungs every 4 (four) hours as needed for wheezing or shortness of breath.    [provider]  amLODipine (NORVASC) 10 MG tablet Take 10 mg by mouth daily.    [provider]  aspirin EC 81 MG tablet Take 81 mg by mouth daily.    [provider]  Cholecalciferol (D3 ADULT PO) Take 400 Units by mouth.    [provider]  clonazePAM (KLONOPIN) 0.5 MG tablet Take 0.5 mg by mouth daily as needed for anxiety.    [provider]  Cranberry 500 MG CAPS Take 15,000 mg by mouth.    [provider]  docusate sodium (COLACE) 100 MG capsule Take 100 mg by mouth daily.    [provider]  Garlic [id number] MG CAPS Take 2,000 mg by mouth daily.    [provider]  losartan (COZAAR) 50 MG tablet Take 50 mg by mouth daily.    [provider]  Melatonin 1 MG TABS Take 2 mg by mouth at bedtime.    [provider]  Multiple Vitamin  (MULTIVITAMIN WITH MINERALS) TABS tablet Take 1 tablet by mouth daily.    [provider]  Omega-3 Fatty Acids (FISH OIL) 1200 MG CAPS Take 1,200 mg by mouth daily.    [provider]  omeprazole (PRILOSEC) 20 MG capsule Take 20 mg by mouth daily.    [provider]  ondansetron (ZOFRAN) 4 MG tablet Take 4 mg by mouth daily as needed for nausea or vomiting.    [provider]  polyethylene glycol (MIRALAX / GLYCOLAX) packet Take 17 g by mouth daily as needed.    [provider]  psyllium (METAMUCIL) 58.6 % powder Take 1 packet by mouth 3 (three) times daily.    [provider]  sertraline (ZOLOFT) 25 MG tablet Take 25 mg by mouth daily.    [provider]  traZODone (DESYREL) 50 MG tablet Take 50 mg by mouth at bedtime.    [provider]  Turmeric 450 MG CAPS Take 450 mg by mouth.    [provider]    Family History No family history on file.  Social History Social History   Tobacco Use   Smoking status: Every Day    Packs/day: 0.50    Types: Cigarettes   Smokeless tobacco:  Never  Vaping Use   Vaping Use: Never used  Substance Use Topics   Alcohol use: Yes    Alcohol/week: 1.0 standard drink of alcohol    Types: 1 Glasses of wine per week   Drug use: No     Allergies   Fentanyl, Lisinopril, Morphine and related, and Sulfa antibiotics   Review of Systems Review of Systems   Physical Exam Triage Vital Signs ED Triage Vitals  Enc Vitals Group     BP      Pulse      Resp      Temp      Temp src      SpO2      Weight      Height      Head Circumference      Peak Flow      Pain Score      Pain Loc      Pain Edu?      Excl. in [place]?    No data found.  Updated Vital Signs There were no vitals taken for this visit.  Visual Acuity Right Eye Distance:   Left Eye Distance:   Bilateral Distance:    Right Eye Near:   Left Eye Near:    Bilateral Near:     Physical Exam Vitals  reviewed.  Constitutional:      Appearance: Normal appearance.  Skin:    General: Skin is warm and dry.  Neurological:     General: No focal deficit present.     Mental Status: She is alert and oriented to person, place, and time.  Psychiatric:        Mood and Affect: Mood normal.        Behavior: Behavior normal.     UC Treatments / Results  Labs (all labs ordered are listed, but only abnormal results are displayed) Labs Reviewed  POCT URINALYSIS DIP (MANUAL ENTRY) - Abnormal; Notable for the following components:      Result Value   Clarity, UA cloudy (*)    Bilirubin, UA small (*)    Ketones, POC UA trace (5) (*)    Blood, UA large (*)    Protein Ur, POC >=300 (*)    Urobilinogen, UA 2.0 (*)    Leukocytes, UA Large (3+) (*)    All other components within normal limits    EKG   Radiology No results found.  Procedures Procedures (including critical care time)  Medications Ordered in UC Medications - No data to display  Initial Impression / Assessment and Plan / UC Course  I have reviewed the triage vital signs and the nursing notes.  Pertinent labs & imaging results that were available during my care of the patient were reviewed by me and considered in my medical decision making (see chart for details).   Patient is afebrile here without recent antipyretics. Satting well on room air. Overall is well appearing, well hydrated, without respiratory distress.   Is suggestive of urinary tract infection.  Concern for possible developing pyelonephritis and will treat with ciprofloxacin.    Final Clinical Impressions(s) / UC Diagnoses   Final diagnoses:  Acute cystitis with hematuria     Discharge Instructions      Follow up here or with your primary care provider if your symptoms are worsening or not improving with treatment.      ED Prescriptions   None    PDMP not reviewed this encounter.   Rose Phi, [place] 12/27/22 1226

## 2022-12-27 NOTE — Discharge Instructions (Addendum)
Follow up here or with your primary care provider if your symptoms are worsening or not improving with treatment.          

## 2022-12-27 NOTE — ED Triage Notes (Signed)
Patient presents to UC for dysuria, hematuria, and urgency x 2 days.Not taking any OTC meds. Hx of UTIs.

## 2023-03-11 ENCOUNTER — Ambulatory Visit
Admission: EM | Admit: 2023-03-11 | Discharge: 2023-03-11 | Disposition: A | Discharge status: Home / Self Care | Payer: 59 | Attending: Urgent Care | Admitting: Urgent Care

## 2023-03-11 DIAGNOSIS — B3731 Acute candidiasis of vulva and vagina: Secondary | ICD-10-CM

## 2023-03-11 DIAGNOSIS — J019 Acute sinusitis, unspecified: Secondary | ICD-10-CM

## 2023-03-11 DIAGNOSIS — B9689 Other specified bacterial agents as the cause of diseases classified elsewhere: Secondary | ICD-10-CM | POA: Diagnosis not present

## 2023-03-11 MED ORDER — PREDNISONE 50 MG PO TABS: 50.0000 mg | ORAL_TABLET | Freq: Every day | ORAL | 0 refills | Status: AC

## 2023-03-11 MED ORDER — AMOXICILLIN-POT CLAVULANATE 875-125 MG PO TABS: 1.0000 | ORAL_TABLET | Freq: Two times a day (BID) | ORAL | 0 refills | Status: AC

## 2023-03-11 MED ORDER — FLUCONAZOLE 150 MG PO TABS: 150.0000 mg | ORAL_TABLET | Freq: Once | ORAL | 0 refills | Status: AC

## 2023-03-11 NOTE — Discharge Instructions (Addendum)
Follow up here or with your primary care provider if your symptoms are worsening or not improving with treatment.     

## 2023-03-11 NOTE — ED Triage Notes (Signed)
Patient to Urgent Care with complaints of bilateral ear pain/ nasal congestion/ chest congestion/ productive cough/ sore throat/ nasal drainage/ headaches. Sinus pain.  Reports symptoms started two weeks ago.  Has been taking mucinex MD/ benadryl.

## 2023-03-11 NOTE — ED Provider Notes (Signed)
Jacqueline Perez    CSN: [id number] Arrival date & time: 03/11/23  1422      History   Chief Complaint Chief Complaint  Patient presents with   Otalgia   Nasal Congestion    HPI Jacqueline Perez is a 67 y.o. female.    Otalgia   Presents to urgent care with complaint of nasal congestion x 2 weeks.  Also reports otalgia.  Past Medical History:  Diagnosis Date   Anxiety    Hyperlipidemia    Hypertension    Rupture of colon [facility])     Patient Active Problem List   Diagnosis Date Noted   Encounter for medication management 12/26/2017   Hypertension 12/24/2014   Emphysema of lung ([facility]) 12/24/2014   S/P colon resection 12/24/2014    Past Surgical History:  Procedure Laterality Date   COLOSTOMY      OB History   No obstetric history on file.      Home Medications    Prior to Admission medications   Medication Sig Start Date End Date Taking? Authorizing Provider  albuterol (VENTOLIN HFA) 108 (90 Base) MCG/ACT inhaler Inhale 2 puffs into the lungs every 4 (four) hours as needed for wheezing or shortness of breath.    [provider]  amLODipine (NORVASC) 10 MG tablet Take 10 mg by mouth daily.    [provider]  aspirin EC 81 MG tablet Take 81 mg by mouth daily.    [provider]  Cholecalciferol (D3 ADULT PO) Take 400 Units by mouth.    [provider]  clonazePAM (KLONOPIN) 0.5 MG tablet Take 0.5 mg by mouth daily as needed for anxiety.    [provider]  Cranberry 500 MG CAPS Take 15,000 mg by mouth.    [provider]  docusate sodium (COLACE) 100 MG capsule Take 100 mg by mouth daily.    [provider]  Garlic 2000 MG CAPS Take 2,000 mg by mouth daily.    [provider]  losartan (COZAAR) 50 MG tablet Take 50 mg by mouth daily.    [provider]  Melatonin 1 MG TABS Take 2 mg by mouth at bedtime.    [provider]  Multiple Vitamin (MULTIVITAMIN WITH  MINERALS) TABS tablet Take 1 tablet by mouth daily.    [provider]  Omega-3 Fatty Acids (FISH OIL) 1200 MG CAPS Take 1,200 mg by mouth daily.    [provider]  omeprazole (PRILOSEC) 20 MG capsule Take 20 mg by mouth daily.    [provider]  ondansetron (ZOFRAN) 4 MG tablet Take 4 mg by mouth daily as needed for nausea or vomiting.    [provider]  polyethylene glycol (MIRALAX / GLYCOLAX) packet Take 17 g by mouth daily as needed.    [provider]  psyllium (METAMUCIL) 58.6 % powder Take 1 packet by mouth 3 (three) times daily.    [provider]  sertraline (ZOLOFT) 25 MG tablet Take 25 mg by mouth daily.    [provider]  traZODone (DESYREL) 50 MG tablet Take 50 mg by mouth at bedtime.    [provider]  Turmeric 450 MG CAPS Take 450 mg by mouth.    [provider]    Family History No family history on file.  Social History Social History   Tobacco Use   Smoking status: Every Day    Packs/day: .5    Types: Cigarettes   Smokeless tobacco: Never  Vaping Use   Vaping Use: Never used  Substance Use Topics   Alcohol use: Yes    Alcohol/week: 1.0 standard drink of alcohol    Types: 1 Glasses of wine per week   Drug use: No     Allergies   Fentanyl, Lisinopril, Morphine, Morphine and related, Sulfa antibiotics, Codeine, and Hydrocodone   Review of Systems Review of Systems  HENT:  Positive for ear pain.      Physical Exam Triage Vital Signs ED Triage Vitals [03/11/23 1445]  Enc Vitals Group     BP 95/63     Pulse Rate 86     Resp 16     Temp 98.9 F (37.2 C)     Temp Source Oral     SpO2 97 %     Weight      Height      Head Circumference      Peak Flow      Pain Score      Pain Loc      Pain Edu?      Excl. in [place]?    No data found.  Updated Vital Signs BP 95/63 (BP Location: Left Arm)   Pulse 86   Temp 98.9 F (37.2 C) (Oral)   Resp 16   SpO2 97%    Visual Acuity Right Eye Distance:   Left Eye Distance:   Bilateral Distance:    Right Eye Near:   Left Eye Near:    Bilateral Near:     Physical Exam Vitals reviewed.  Constitutional:      Appearance: She is ill-appearing.  Cardiovascular:     Rate and Rhythm: Normal rate and regular rhythm.     Pulses: Normal pulses.     Heart sounds: Normal heart sounds.  Pulmonary:     Effort: Pulmonary effort is normal.     Breath sounds: Normal breath sounds.  Skin:    General: Skin is warm and dry.  Neurological:     General: No focal deficit present.     Mental Status: She is alert and oriented to person, place, and time.  Psychiatric:        Mood and Affect: Mood normal.        Behavior: Behavior normal.      UC Treatments / Results  Labs (all labs ordered are listed, but only abnormal results are displayed) Labs Reviewed - No data to display  EKG   Radiology No results found.  Procedures Procedures (including critical care time)  Medications Ordered in UC Medications - No data to display  Initial Impression / Assessment and Plan / UC Course  I have reviewed the triage vital signs and the nursing notes.  Pertinent labs & imaging results that were available during my care of the patient were reviewed by me and considered in my medical decision making (see chart for details).   [name] is a 67 y.o. female presenting with sinusitis. Patient is afebrile without recent antipyretics, satting well on room air. Overall is ill appearing though non-toxic, well hydrated, without respiratory distress. Pulmonary exam is unremarkable.  Lungs CTAB without wheezing, rhonchi, rales.  Given duration of patient's symptoms, concern for acute bacterial sinusitis and will treat with Augmentin.  Also given prednisone given the severity of her sinusitis discomfort.  Counseled patient on potential for adverse effects with medications prescribed/recommended today, ER and  return-to-clinic precautions discussed, patient verbalized understanding and agreement with care plan.   Final Clinical Impressions(s) /  UC Diagnoses   Final diagnoses:  None   Discharge Instructions   None    ED Prescriptions   None    PDMP not reviewed this encounter.   Charma Igo, [place] 03/11/23 [phone]

## 2024-01-09 NOTE — Progress Notes (Signed)
 Subjective:    Chief Complaint  Patient presents with  . Emesis    X 2 weeks   Has been vomiting every day for 2 weeks, usually in the mornings  Has been able to drink water normally  Does not have much of an appetite but has been trying to eat a little bit   Has been dizzy and lightheaded for more than 2 weeks   . Diarrhea    Diarrhea for about a week  States she has colon issues   . Otalgia    Bilateral ear ache   Feels her ears full with fluid  States its been going on for months   . Alcohol Problem    Pt states she is trying to quit drinking. She is having shakes Weaning off alcohol for about a week Last drink was Friday 3/7  Almarie, RN triaged patient and states that they are okay to start and is aware of alcohol use and recent attempts at cessation.      History was provided by the patient. Jacqueline Perez is a 68 y.o., female  who presents with history of developing respiratory infection 1 month ago, symptoms improved except bilateral ear congestion.  Reports 2 weeks ago, hugged her son, who subsequently was found to have gastroenteritis.  Patient reports she has been vomiting ever since.  Reports she is sick and tired of feeling lousy.  Reports trying to quit drinking alcohol-reports drinks 1 shot per day-since Friday 3/7.  Patient reports a long time ago her doctor gave her some Librium to help her quit.  Did not drink for several months thereafter.  Did not attend any sort of self-help or 12-step program.  Reports she then ran out of her anxiety medicine, could not get to the doctor, so switched alcohol.  Reports she does not drive, so her neighbors get her alcohol for her.  Advised her her neighbors could also take her to the doctor.  In any event, complains of feeling lightheaded when standing, feels cold all the time, shivering.  Unable to eat, drinking water, occasional soup.  No known fever.  Tremulous as above.  No complaints of chest pain or shortness of breath;  no abdominal pain, no hematemesis. Symptoms include: above   Patient denies: above Treatment to date: above   No past medical history on file. The following portions of the patient's history were reviewed and updated as appropriate: allergies, current medications, past social history, and problem list.  Review of Systems A complete review of systems was performed.  Positive and pertinent negative responses are documented in the HPI, and all other systems are negative.   Objective:     Vitals:   01/09/24 1009 01/09/24 1040 01/09/24 1045 01/09/24 1050  BP: 110/71 121/82 113/82 123/75  Pulse: 109 96 105 (!) 120  Temp: 36.6 C (97.8 F)     TempSrc: Oral     SpO2: 99% 100% 100% 100%  Weight: 48.5 kg (107 lb)     Height: 157.5 cm (5' 2)     PainSc:   8       General Appearance: Petite.  Pleasant and cooperative.  Mildly tremulous. HEENT:  [place]/AT, PERRLA, EOMI, sclera clear, conjunctivae pink.   Mouth and throat: Posterior oropharynx moderate erythema; tongue midline. TM's: Mild erythema the effusions bilaterally.  Neck:  Supple, no JVD or adenopathy. Cardiovascular:  Regular rate and rhythm.  Lungs: Diminished throughout with fair to poor effort. Abdomen:  Soft, nontender, nondistended. Normoactive  bowel sounds.  Back: No CVA tenderness. Extremities:  No cyanosis, clubbing, or edema. Neuro:  Alert and orient x4; mildly tremulous, otherwise nonfocal.    Lab/X-ray/Treatments:   Chest x-ray-appears to have pleural effusion on left CMP-glucose 121, potassium 3.4, AST 73, total bilirubin 2.5 TSH-pending CBC-5.8K, 67% neutrophils, 25% lymphocytes, platelet count 1 44K, MCV 105.2 UA-1+ protein, trace glucose, trace ketones, 1+ bilirubin  Orthostatics-stable blood pressure; heart rate increases greater than 20 bpm       Assessment:      1. Acute bacterial sinusitis -     moxifloxacin (AVELOX) 400 mg tablet; Take 1 tablet (400 mg total) by mouth once daily for 10 days   Dispense: 10 tablet; Refill: 0  2. Bronchitis with bronchospasm, unspecified -     predniSONE  (DELTASONE ) 10 MG tablet; Take 1 tablet (10 mg total) by mouth 2 (two) times daily for 5 days  Dispense: 10 tablet; Refill: 0 -     benzonatate (TESSALON) 200 MG capsule; Take 1 capsule (200 mg total) by mouth 3 (three) times daily as needed for Cough for up to 7 days  Dispense: 20 capsule; Refill: 0  3. Pleural effusion, left  4. Chronic cough -     Comprehensive Metabolic Panel (CMP) -     X-ray chest PA and lateral; Future  5. Nausea and vomiting, unspecified vomiting type -     Comprehensive Metabolic Panel (CMP) -     Thyroid Stimulating-Hormone (TSH) -     CBC w/auto Differential (5 Part) -     X-ray chest PA and lateral; Future -     promethazine (PHENERGAN) 25 MG tablet; Take 1 tablet (25 mg total) by mouth every 6 (six) hours as needed for Nausea (or mild discomfort) for up to 7 days  Dispense: 15 tablet; Refill: 0  6. Diarrhea, unspecified type -     Urinalysis w/Microscopic -     Comprehensive Metabolic Panel (CMP) -     Thyroid Stimulating-Hormone (TSH) -     CBC w/auto Differential (5 Part) -     X-ray chest PA and lateral; Future  7. Dehydration -     Urinalysis w/Microscopic -     Comprehensive Metabolic Panel (CMP) -     Thyroid Stimulating-Hormone (TSH) -     CBC w/auto Differential (5 Part) -     X-ray chest PA and lateral; Future  8. Dizziness -     Urinalysis w/Microscopic -     Comprehensive Metabolic Panel (CMP) -     Thyroid Stimulating-Hormone (TSH) -     CBC w/auto Differential (5 Part) -     X-ray chest PA and lateral; Future   Will cover with Avelox, guaifenesin, low-dose prednisone , Tessalon Perles.  Fluids and rest.  Phenergan for nausea and vomiting.  Librium to used sparingly/cautiously.  Patient and her neighbor, who understands to assist with dosing if necessary.  Work note provided for the patient for the remainder this week.  Instructed to seek  additional counseling such as 12-step or alcohol counseling.  Advised patient she must follow-up with her PCP within 2 to 4 weeks for repeat chest x-ray to ensure resolution.  I spent a total of > 60 minutes in both face-to-face and non-face-to-face activities, excluding procedures performed, for this visit on the date of this encounter 01/09/2024  Upon discharge, patient request what about the letter from my lawyer.  Advised her I knew nothing of any letter for her lawyer.  Patient reports she  was due for jury duty, and could not do them because she was sick.  Advised her to use the note provided.     Plan:   Requested Prescriptions   Signed Prescriptions Disp Refills  . promethazine (PHENERGAN) 25 MG tablet 15 tablet 0    Sig: Take 1 tablet (25 mg total) by mouth every 6 (six) hours as needed for Nausea (or mild discomfort) for up to 7 days  . chlordiazePOXIDE (LIBRIUM) 10 MG capsule 20 capsule 0    Sig: Take 1 capsule (10 mg total) by mouth 3 (three) times daily as needed for Anxiety for up to 20 doses  . predniSONE  (DELTASONE ) 10 MG tablet 10 tablet 0    Sig: Take 1 tablet (10 mg total) by mouth 2 (two) times daily for 5 days  . moxifloxacin (AVELOX) 400 mg tablet 10 tablet 0    Sig: Take 1 tablet (400 mg total) by mouth once daily for 10 days  . benzonatate (TESSALON) 200 MG capsule 20 capsule 0    Sig: Take 1 capsule (200 mg total) by mouth 3 (three) times daily as needed for Cough for up to 7 days

## 2024-02-15 ENCOUNTER — Other Ambulatory Visit: Payer: Self-pay | Admitting: Nurse Practitioner

## 2024-02-15 DIAGNOSIS — K869 Disease of pancreas, unspecified: Secondary | ICD-10-CM

## 2024-04-21 ENCOUNTER — Other Ambulatory Visit: Payer: Self-pay

## 2024-04-21 ENCOUNTER — Emergency Department

## 2024-04-21 ENCOUNTER — Encounter: Payer: Self-pay | Admitting: Emergency Medicine

## 2024-04-21 ENCOUNTER — Emergency Department
Admission: EM | Admit: 2024-04-21 | Discharge: 2024-04-21 | Disposition: A | Discharge status: Home / Self Care | Attending: Emergency Medicine | Admitting: Emergency Medicine

## 2024-04-21 DIAGNOSIS — E871 Hypo-osmolality and hyponatremia: Secondary | ICD-10-CM | POA: Diagnosis not present

## 2024-04-21 DIAGNOSIS — I10 Essential (primary) hypertension: Secondary | ICD-10-CM | POA: Insufficient documentation

## 2024-04-21 DIAGNOSIS — R1010 Upper abdominal pain, unspecified: Secondary | ICD-10-CM

## 2024-04-21 DIAGNOSIS — R1030 Lower abdominal pain, unspecified: Secondary | ICD-10-CM | POA: Diagnosis not present

## 2024-04-21 DIAGNOSIS — R0789 Other chest pain: Secondary | ICD-10-CM | POA: Insufficient documentation

## 2024-04-21 DIAGNOSIS — Z7982 Long term (current) use of aspirin: Secondary | ICD-10-CM | POA: Insufficient documentation

## 2024-04-21 DIAGNOSIS — Z79899 Other long term (current) drug therapy: Secondary | ICD-10-CM | POA: Insufficient documentation

## 2024-04-21 LAB — CBC WITH DIFFERENTIAL/PLATELET
Abs Immature Granulocytes: 0.01 10*3/uL (ref 0.00–0.07)
Basophils Absolute: 0.1 10*3/uL (ref 0.0–0.1)
Basophils Relative: 1 %
Eosinophils Absolute: 0 10*3/uL (ref 0.0–0.5)
Eosinophils Relative: 1 %
HCT: 31.8 % — ABNORMAL LOW (ref 36.0–46.0)
Hemoglobin: 11.5 g/dL — ABNORMAL LOW (ref 12.0–15.0)
Immature Granulocytes: 0 %
Lymphocytes Relative: 35 %
Lymphs Abs: 1.7 10*3/uL (ref 0.7–4.0)
MCH: 36.7 pg — ABNORMAL HIGH (ref 26.0–34.0)
MCHC: 36.2 g/dL — ABNORMAL HIGH (ref 30.0–36.0)
MCV: 101.6 fL — ABNORMAL HIGH (ref 80.0–100.0)
Monocytes Absolute: 0.5 10*3/uL (ref 0.1–1.0)
Monocytes Relative: 11 %
Neutro Abs: 2.5 10*3/uL (ref 1.7–7.7)
Neutrophils Relative %: 52 %
Platelets: 225 10*3/uL (ref 150–400)
RBC: 3.13 MIL/uL — ABNORMAL LOW (ref 3.87–5.11)
RDW: 12.3 % (ref 11.5–15.5)
WBC: 4.8 10*3/uL (ref 4.0–10.5)
nRBC: 0 % (ref 0.0–0.2)

## 2024-04-21 LAB — URINALYSIS, ROUTINE W REFLEX MICROSCOPIC
Bilirubin Urine: NEGATIVE
Glucose, UA: NEGATIVE mg/dL
Hgb urine dipstick: NEGATIVE
Ketones, ur: NEGATIVE mg/dL
Leukocytes,Ua: NEGATIVE
Nitrite: NEGATIVE
Protein, ur: NEGATIVE mg/dL
Specific Gravity, Urine: 1.005 (ref 1.005–1.030)
pH: 6 (ref 5.0–8.0)

## 2024-04-21 LAB — COMPREHENSIVE METABOLIC PANEL WITH GFR
ALT: 26 U/L (ref 0–44)
AST: 38 U/L (ref 15–41)
Albumin: 3.2 g/dL — ABNORMAL LOW (ref 3.5–5.0)
Alkaline Phosphatase: 70 U/L (ref 38–126)
Anion gap: 9 (ref 5–15)
BUN: 13 mg/dL (ref 8–23)
CO2: 22 mmol/L (ref 22–32)
Calcium: 9 mg/dL (ref 8.9–10.3)
Chloride: 100 mmol/L (ref 98–111)
Creatinine, Ser: 0.93 mg/dL (ref 0.44–1.00)
GFR, Estimated: >60 mL/min (ref >60)
Glucose, Bld: 100 mg/dL — ABNORMAL HIGH (ref 70–99)
Potassium: 3.7 mmol/L (ref 3.5–5.1)
Sodium: 131 mmol/L — ABNORMAL LOW (ref 135–145)
Total Bilirubin: 0.7 mg/dL (ref 0.0–1.2)
Total Protein: 5.8 g/dL — ABNORMAL LOW (ref 6.5–8.1)

## 2024-04-21 LAB — TROPONIN I (HIGH SENSITIVITY)
Troponin I (High Sensitivity): 10 ng/L (ref <18)
Troponin I (High Sensitivity): 14 ng/L (ref <18)

## 2024-04-21 LAB — LIPASE, BLOOD: Lipase: 47 U/L (ref 11–51)

## 2024-04-21 LAB — D-DIMER, QUANTITATIVE: D-Dimer, Quant: 0.93 ug{FEU}/mL — ABNORMAL HIGH (ref 0.00–0.50)

## 2024-04-21 MED ORDER — ONDANSETRON 4 MG PO TBDP: 4.0000 mg | ORAL_TABLET | Freq: Four times a day (QID) | ORAL | 0 refills | Status: AC | PRN

## 2024-04-21 MED ORDER — ALUM & MAG HYDROXIDE-SIMETH 200-200-20 MG/5ML PO SUSP
30.0000 mL | Freq: Once | ORAL | Status: AC
  Administered 2024-04-21: 30 mL via ORAL
  Filled 2024-04-21: qty 30

## 2024-04-21 MED ORDER — PANTOPRAZOLE SODIUM 40 MG PO TBEC: 40.0000 mg | DELAYED_RELEASE_TABLET | Freq: Every day | ORAL | 1 refills | Status: AC

## 2024-04-21 MED ORDER — SODIUM CHLORIDE 0.9 % IV BOLUS (SEPSIS)
500.0000 mL | Freq: Once | INTRAVENOUS | Status: AC
  Administered 2024-04-21: 500 mL via INTRAVENOUS

## 2024-04-21 MED ORDER — PANTOPRAZOLE SODIUM 40 MG IV SOLR
40.0000 mg | Freq: Once | INTRAVENOUS | Status: AC
  Administered 2024-04-21: 40 mg via INTRAVENOUS
  Filled 2024-04-21: qty 10

## 2024-04-21 MED ORDER — IOHEXOL 350 MG/ML SOLN
100.0000 mL | Freq: Once | INTRAVENOUS | Status: AC | PRN
  Administered 2024-04-21: 100 mL via INTRAVENOUS

## 2024-04-21 MED ORDER — ACETAMINOPHEN 500 MG PO TABS
1000.0000 mg | ORAL_TABLET | Freq: Once | ORAL | Status: DC
  Filled 2024-04-21 (×2): qty 2

## 2024-04-21 MED ORDER — OXYCODONE HCL 5 MG PO TABS: 5.0000 mg | ORAL_TABLET | Freq: Three times a day (TID) | ORAL | 0 refills | Status: AC | PRN

## 2024-04-21 MED ORDER — ONDANSETRON HCL 4 MG/2ML IJ SOLN
4.0000 mg | Freq: Once | INTRAMUSCULAR | Status: AC
  Administered 2024-04-21: 4 mg via INTRAVENOUS
  Filled 2024-04-21: qty 2

## 2024-04-21 MED ORDER — HYDROMORPHONE HCL 1 MG/ML IJ SOLN
1.0000 mg | Freq: Once | INTRAMUSCULAR | Status: AC
  Administered 2024-04-21: 1 mg via INTRAVENOUS
  Filled 2024-04-21: qty 1

## 2024-04-21 NOTE — ED Provider Notes (Signed)
 [facility] Provider Note    Event Date/Time   First MD Initiated Contact with Patient 04/21/24 [phone]     (approximate)   History   Chest Pain   HPI  Jacqueline Perez is a 68 y.o. female with history of hypertension, hyperlipidemia who presents to the emergency department with lower chest and upper abdominal pain that started tonight.  Describes as a discomfort that radiates into her back and across her shoulders.  She has had a previous cholecystectomy per her report.  She reports nausea and vomiting.  She did have some dizziness.  No shortness of breath, fevers, cough, lower extremity swelling or pain.  No history of PE or DVT.  Patient given aspirin and nitroglycerin with EMS without any relief.  Now complaining of headache after nitroglycerin.   History provided by patient.    Past Medical History:  Diagnosis Date   Anxiety    Hyperlipidemia    Hypertension    Rupture of colon (HCC)     Past Surgical History:  Procedure Laterality Date   COLOSTOMY      MEDICATIONS:  Prior to Admission medications   Medication Sig Start Date End Date Taking? Authorizing Provider  albuterol (VENTOLIN HFA) 108 (90 Base) MCG/ACT inhaler Inhale 2 puffs into the lungs every 4 (four) hours as needed for wheezing or shortness of breath.    [provider]  amLODipine (NORVASC) 10 MG tablet Take 10 mg by mouth daily.    [provider]  amoxicillin -clavulanate (AUGMENTIN ) 875-125 MG tablet Take 1 tablet by mouth every 12 (twelve) hours. 03/11/23   Immordino, Garnette, FNP  aspirin EC 81 MG tablet Take 81 mg by mouth daily. Patient not taking: Reported on 03/11/2023    [provider]  Cholecalciferol (D3 ADULT PO) Take 400 Units by mouth.    [provider]  clonazePAM (KLONOPIN) 0.5 MG tablet Take 0.5 mg by mouth daily as needed for anxiety. Patient not taking: Reported on 03/11/2023    [provider]  Cranberry 500 MG CAPS  Take 15,000 mg by mouth.    [provider]  docusate sodium (COLACE) 100 MG capsule Take 100 mg by mouth daily.    [provider]  Garlic 2000 MG CAPS Take 2,000 mg by mouth daily.    [provider]  losartan (COZAAR) 50 MG tablet Take 50 mg by mouth daily.    [provider]  Melatonin 1 MG TABS Take 2 mg by mouth at bedtime.    [provider]  Multiple Vitamin (MULTIVITAMIN WITH MINERALS) TABS tablet Take 1 tablet by mouth daily.    [provider]  Omega-3 Fatty Acids (FISH OIL) 1200 MG CAPS Take 1,200 mg by mouth daily.    [provider]  omeprazole (PRILOSEC) 20 MG capsule Take 20 mg by mouth daily.    [provider]  ondansetron  (ZOFRAN ) 4 MG tablet Take 4 mg by mouth daily as needed for nausea or vomiting.    [provider]  polyethylene glycol (MIRALAX / GLYCOLAX) packet Take 17 g by mouth daily as needed.    [provider]  psyllium (METAMUCIL) 58.6 % powder Take 1 packet by mouth 3 (three) times daily.    [provider]  sertraline (ZOLOFT) 25 MG tablet Take 25 mg by mouth daily. Patient not taking: Reported on 03/11/2023    [provider]  traZODone (DESYREL) 50 MG tablet Take 50 mg by mouth at bedtime.  [provider]  Turmeric 450 MG CAPS Take 450 mg by mouth.    [provider]    Physical Exam   Triage Vital Signs: ED Triage Vitals [04/21/24 0338]  Encounter Vitals Group     BP (!) 171/93     Girls Systolic BP Percentile      Girls Diastolic BP Percentile      Boys Systolic BP Percentile      Boys Diastolic BP Percentile      Pulse Rate 63     Resp 18     Temp (!) 97.5 F (36.4 C)     Temp Source Oral     SpO2 100 %     Weight      Height      Head Circumference      Peak Flow      Pain Score 9     Pain Loc      Pain Education      Exclude from Growth Chart      Most recent vital signs: Vitals:   04/21/24 0711 04/21/24  0712  BP:    Pulse:  78  Resp:  12  Temp:    SpO2: 92% 93%    CONSTITUTIONAL: Alert, responds appropriately to questions. Well-appearing; well-nourished HEAD: Normocephalic, atraumatic EYES: Conjunctivae clear, pupils appear equal, sclera nonicteric ENT: normal nose; moist mucous membranes NECK: Supple, normal ROM CARD: RRR; S1 and S2 appreciated RESP: Normal chest excursion without splinting or tachypnea; breath sounds clear and equal bilaterally; no wheezes, no rhonchi, no rales, no hypoxia or respiratory distress, speaking full sentences ABD/GI: Non-distended; soft, non-tender, no rebound, no guarding, no peritoneal signs BACK: The back appears normal EXT: Normal ROM in all joints; no deformity noted, no edema SKIN: Normal color for age and race; warm; no rash on exposed skin NEURO: Moves all extremities equally, normal speech PSYCH: The patient's mood and manner are appropriate.   ED Results / Procedures / Treatments   LABS: (all labs ordered are listed, but only abnormal results are displayed) Labs Reviewed  CBC WITH DIFFERENTIAL/PLATELET - Abnormal; Notable for the following components:      Result Value   RBC 3.13 (*)    Hemoglobin 11.5 (*)    HCT 31.8 (*)    MCV 101.6 (*)    MCH 36.7 (*)    MCHC 36.2 (*)    All other components within normal limits  COMPREHENSIVE METABOLIC PANEL WITH GFR - Abnormal; Notable for the following components:   Sodium 131 (*)    Glucose, Bld 100 (*)    Total Protein 5.8 (*)    Albumin 3.2 (*)    All other components within normal limits  URINALYSIS, ROUTINE W REFLEX MICROSCOPIC - Abnormal; Notable for the following components:   Color, Urine YELLOW (*)    APPearance CLEAR (*)    All other components within normal limits  D-DIMER, QUANTITATIVE - Abnormal; Notable for the following components:   D-Dimer, Quant 0.93 (*)    All other components within normal limits  LIPASE, BLOOD  TROPONIN I (HIGH SENSITIVITY)  TROPONIN I (HIGH  SENSITIVITY)     EKG:  EKG Interpretation Date/Time:  Saturday April 21 2024 03:37:03 EDT Ventricular Rate:  64 PR Interval:  179 QRS Duration:  93 QT Interval:  458 QTC Calculation: 473 R Axis:   74  Text Interpretation: Sinus rhythm Anteroseptal infarct, age indeterminate Confirmed by Neomi Neptune [phone]) on 04/21/2024 4:13:21 AM  RADIOLOGY: My personal review and interpretation of imaging: CT of the chest, abdomen pelvis showed no acute abnormality.  I have personally reviewed all radiology reports.   CT Angio Chest PE W and/or Wo Contrast Result Date: 04/21/2024 CLINICAL DATA:  Pulmonary embolism suspected EXAM: CT ANGIOGRAPHY CHEST WITH CONTRAST TECHNIQUE: Multidetector CT imaging of the chest was performed using the standard protocol during bolus administration of intravenous contrast. Multiplanar CT image reconstructions and MIPs were obtained to evaluate the vascular anatomy. Multiplanar image (3D post-processing) reconstructions and MIPs were obtained to evaluate the vascular anatomy. RADIATION DOSE REDUCTION: This exam was performed according to the departmental dose-optimization program which includes automated exposure control, adjustment of the mA and/or kV according to patient size and/or use of iterative reconstruction technique. CONTRAST:  OMNIPAQUE  IOHEXOL  350 MG/ML SOLN COMPARISON:  CT of the chest abdomen pelvis performed August 03, 2022 FINDINGS: Cardiovascular: Satisfactory opacification of the pulmonary arteries to the segmental level. No evidence of pulmonary embolism. Enlarged heart. No pericardial effusion. Coronary artery atherosclerotic vascular disease. Atherosclerotic changes in the thoracic aorta and major branch vessels. Mediastinum/Nodes: No enlarged mediastinal, hilar, or axillary lymph nodes. Thyroid gland, trachea, and esophagus demonstrate no significant findings. Lungs/Pleura: Emphysema.  No pleural effusion or pneumothorax. Upper Abdomen: No  acute abnormality. Musculoskeletal: No acute chest wall abnormality. Healed sternal fracture. Review of the MIP images confirms the above findings. IMPRESSION: 1. Emphysema. 2. No evidence of pulmonary embolism to the segmental level. Electronically Signed   By: Maude Naegeli M.D.   On: 04/21/2024 06:48   CT ABDOMEN PELVIS W CONTRAST Result Date: 04/21/2024 CLINICAL DATA:  Left lower quadrant abdominal pain EXAM: CT ABDOMEN AND PELVIS WITH CONTRAST TECHNIQUE: Multidetector CT imaging of the abdomen and pelvis was performed using the standard protocol following bolus administration of intravenous contrast. RADIATION DOSE REDUCTION: This exam was performed according to the departmental dose-optimization program which includes automated exposure control, adjustment of the mA and/or kV according to patient size and/or use of iterative reconstruction technique. CONTRAST:  OMNIPAQUE  IOHEXOL  350 MG/ML SOLN COMPARISON:  CT of the chest abdomen pelvis performed August 03, 2022 FINDINGS: Lower chest: Reported separately. Hepatobiliary: Gallbladder is absent. Pancreas: Not significantly changed. Spleen: Normal in size without focal abnormality. Adrenals/Urinary Tract: Unchanged. No hydronephrosis or significant nephrolithiasis. The urinary bladder is grossly unremarkable. Stomach/Bowel: No dilated loops of bowel are seen. The enteric anastomosis is present in the pelvis. Scattered colonic diverticula. Vascular/Lymphatic: Atherosclerotic changes are present in the abdominal aorta and major branch vessels. The abdominal aorta measures 2.5 cm, not significantly changed. Reproductive: Uterus and bilateral adnexa are unremarkable. Other: Nothing significant. Musculoskeletal: Degenerative changes are present in lumbar spine, worse at L5-S1. IMPRESSION: 1. No significant finding in the abdomen or pelvis. 2. Postsurgical changes from prior partial colectomy with anastomosis in the left pelvis. Electronically Signed   By: Maude Naegeli M.D.   On: 04/21/2024 06:44     PROCEDURES:     .1-3 Lead EKG Interpretation  Performed by: Derrik Mceachern, Josette SAILOR, DO Authorized by: Michaelyn Wall, Josette SAILOR, DO     Interpretation: normal     ECG rate:  78   ECG rate assessment: normal     Rhythm: sinus rhythm     Ectopy: none     Conduction: normal       IMPRESSION / MDM / ASSESSMENT AND PLAN / ED COURSE  I reviewed the triage vital signs and the nursing notes.    Patient here with chest pain, upper  abdominal pain.  The patient is on the cardiac monitor to evaluate for evidence of arrhythmia and/or significant heart rate changes.   DIFFERENTIAL DIAGNOSIS (includes but not limited to):   ACS, PE, pneumonia, CHF, chest wall pain, gastritis, GERD, esophagitis, H. pylori, ulcer, colitis, diverticulitis, bowel obstruction   Patient's presentation is most consistent with acute presentation with potential threat to life or bodily function.   PLAN: Will obtain abdominal labs, cardiac labs, D-dimer.  Will give Mylanta, Protonix , Tylenol  and reassess.   MEDICATIONS GIVEN IN ED: Medications  ondansetron  (ZOFRAN ) injection 4 mg (4 mg Intravenous Given 04/21/24 0430)  pantoprazole  (PROTONIX ) injection 40 mg (40 mg Intravenous Given 04/21/24 0430)  alum & mag hydroxide-simeth (MAALOX/MYLANTA) 200-200-20 MG/5ML suspension 30 mL (30 mLs Oral Given 04/21/24 0430)  sodium chloride 0.9 % bolus 500 mL (0 mLs Intravenous Stopped 04/21/24 0703)  iohexol  (OMNIPAQUE ) 350 MG/ML injection 100 mL (100 mLs Intravenous Contrast Given 04/21/24 0548)  HYDROmorphone  (DILAUDID ) injection 1 mg (1 mg Intravenous Given 04/21/24 0532)  HYDROmorphone  (DILAUDID ) injection 1 mg (1 mg Intravenous Given 04/21/24 0707)     ED COURSE: Patient refusing Tylenol .  Requesting Dilaudid .  Will give dose here.   No leukocytosis.  Normal LFTs, lipase.  Troponin x 2 negative.  D-dimer elevated so CTA of the chest as well as CT of the abdomen pelvis was obtained.  Both of  these were reviewed and interpreted by myself and the radiologist and showed no acute findings.  Urine also showed no sign of infection.  Patient tolerating p.o. here.  She is feeling better after 2 rounds of Dilaudid .  I feel she is safe for discharge.  Will discharge with PPI, pain medication and recommended close PCP follow-up.  Recommended bland diet.  Symptoms may be secondary to gastritis, GERD.   At this time, I do not feel there is any life-threatening condition present. I reviewed all nursing notes, vitals, pertinent previous records.  All lab and urine results, EKGs, imaging ordered have been independently reviewed and interpreted by myself.  I reviewed all available radiology reports from any imaging ordered this visit.  Based on my assessment, I feel the patient is safe to be discharged home without further emergent workup and can continue workup as an outpatient as needed. Discussed all findings, treatment plan as well as usual and customary return precautions.  They verbalize understanding and are comfortable with this plan.  Outpatient follow-up has been provided as needed.  All questions have been answered.   CONSULTS: Admission considered but patient's workup reassuring, chest pain atypical in nature with negative troponins.  Patient safe for outpatient management.   OUTSIDE RECORDS REVIEWED: Reviewed last internal medicine note on 01/09/2024.       FINAL CLINICAL IMPRESSION(S) / ED DIAGNOSES   Final diagnoses:  Atypical chest pain  Upper abdominal pain     Rx / DC Orders   ED Discharge Orders          Ordered    pantoprazole  (PROTONIX ) 40 MG tablet  Daily        04/21/24 0740    oxyCODONE  (ROXICODONE ) 5 MG immediate release tablet  Every 8 hours PRN        04/21/24 0740    ondansetron  (ZOFRAN -ODT) 4 MG disintegrating tablet  Every 6 hours PRN        04/21/24 0740             Note:  This document was prepared using Dragon voice recognition software and may  include unintentional dictation errors.   Hatcher Froning, Josette SAILOR, DO 04/22/24 [phone]

## 2024-04-21 NOTE — ED Triage Notes (Signed)
 Pt to ED via [facility] from home with c/o L sided CP that radiates to underneath her L breast. Per EMS.  Per EMS CP woke pt up out of sleep at approx 0130. Per EMS pt reports pain between her shoulder blades. Depression in V1 noted on EKG by EMS.   Per EMS pain reproduceable on palpation, 1 nitroglycerin spray and 4 baby asa given en route. Per EMS pt seen at [facility] neuro yesterday for brain scan. Pt has had multiple fall due to her feet not working.

## 2024-05-11 NOTE — Progress Notes (Signed)
 Jacqueline Perez is seen in consultation at the request of Dr. Freddrick for the evaluation of bilateral hearing loss.   Dear Dr. Freddrick,  Thank you for referring Jacqueline Perez; as you know she is a 68 y.o. female who presents for the evaluation of bilateral hearing loss. She reports HL left worse than right. She reports a recent ear infection with otorrhea and ear pressure.  She denied any history of otologic surgery.  Past Medical History She  has a past medical history of Anxiety, Cataract, COPD (chronic obstructive pulmonary disease), Diverticulitis of colon, Dry eyes, and Hypertension.  Past Surgical History Her  has a past surgical history that includes Colon surgery; Tubal ligation; pr colonoscopy thru stoma,lesn rmvl w/snare (N/A, 09/02/2015); pr removal of ovary/tube(s) (Left, 10/17/2015); pr release ureter,retroper fibrosis (Left, 10/17/2015); pr part removal colon w coloproctostomy (N/A, 10/17/2015); pr rev upper eyelid w excess skin (Bilateral, 11/22/2017); Oculoplastic Surgery (Bilateral, 11/22/2017); pr shldr arthroscop,surg,w/rotat cuff repr (Right, 06/01/2018); pr shldr arthroscop,surg,dis claviculectomy (Right, 06/01/2018); pr arthroscopy shoulder surgical biceps tenodesis (Right, 06/01/2018); pr surgical arthroscopy shoulder xtnsv dbrdmt 3+ (Right, 06/01/2018); pr shldr arthroscop,part acromioplas (Right, 06/01/2018); and pr lap,cholecystectomy/graph (N/A, 03/12/2020).  Past Family History Her family history includes COPD in her mother; Cancer in her maternal uncle, maternal uncle, and maternal uncle; Dementia in her mother; Glaucoma in her paternal aunt; Hypertension in her mother; Migraines in her mother; Stroke in her father.  Past Social History She  reports that she has been smoking cigarettes. She has never used smokeless tobacco. She reports current alcohol use of about 6.0 standard drinks of alcohol per week. She reports that she does not use drugs.  Medications/Allergies Her current  medication(s) include:   Current Outpatient Medications:  .  cholecalciferol, vitamin D3, 25 mcg (1,000 unit) Chew, Chew 400 Units., Disp: , Rfl:  .  losartan (COZAAR) 50 MG tablet, Take 1 tablet (50 mg total) by mouth daily., Disp: , Rfl:  .  traZODone (DESYREL) 100 MG tablet, , Disp: , Rfl:  .  amLODIPine (NORVASC) 10 MG tablet, Take 1 tablet (10 mg total) by mouth daily. (Patient not taking: Reported on 05/17/2024), Disp: , Rfl:  .  cranberry 500 mg cap, Take 15,000 mg by mouth., Disp: , Rfl:  .  garlic 2,000 mg cap, Take by mouth daily., Disp: , Rfl:  .  HYDROcodone -acetaminophen  (NORCO) 5-325 mg per tablet, Take 1 tablet by mouth every six (6) hours as needed for pain for up to 5 doses., Disp: 5 tablet, Rfl: 0 .  melatonin 10 mg cap, Take 1 tablet by mouth nightly., Disp: , Rfl:  .  omega-3/dha/epa/fish oil (FISH OIL-OMEGA-3 FATTY ACIDS) 300-1,000 mg capsule, Take 2 capsules (2 g total) by mouth daily., Disp: , Rfl:  .  ondansetron  (ZOFRAN ) 4 MG tablet, Take 1 tablet (4 mg total) by mouth every eight (8) hours as needed for nausea., Disp: , Rfl:  .  pediatric multivitamin Chew tablet, Chew 1 tablet., Disp: , Rfl:  .  polyethylene glycol (GLYCOLAX) 17 gram/dose powder, Take 17 g by mouth., Disp: , Rfl:  .  psyllium husk, aspartame, (METAMUCIL SUGAR-FREE, ASPART,) 3.4 gram/5.8 gram Powd, Take 1 packet by mouth daily., Disp: , Rfl:  Allergies: Sulfa (sulfonamide antibiotics), Fentanyl, Lisinopril, Morphine, Codeine, and Hydrocodone ,  Review of systems  Review of systems was reviewed on attached notes/patient intake forms.    Physical Examination Temp 36.3 C (97.4 F) (Temporal)   General: well appearing, stated age, no distress  Communicates age  appropriate, responds to speech well Head - atraumatic, normocephalic  Face - no surface abnormalities Eyes - no conjunctivitis, no scleritis/iritis, EOM full  Nose - external nose without deformity Oral Cavity/Oropharynx/Lips:  Normal mucous  membranes Salivary Glands - no mass or asymmetry Neck - no masses/adenopathy Lymphatic - no neck lymphadenopathy Cardiovascular - no clubbing/cyanosis/edema in hands Respiratory - breathing comfortably, no audible wheezing or stridor Psychiatric- appropriate mood and affect Neurologic - cranial nerves 2-12 intact Facial Strength - HB 1/6 bilaterally  Ears - External ear- normal, no lesions, no malformations  Otoscopy - right EAC clear, TM intact, left EAC clear, TM slightly dull but no apparent infection  Audiogram The 05/17/2024 audiogram was personally reviewed and interpreted. This demonstrates a right SNHL, left mixed loss   Tympanogram The tympanogram was personally reviewed and interpreted. This demonstrates C on right, B on left  Imaging I personally reviewed and interpreted the patients imaging studies. CT head with scattered mastoid effusions bilaterally CT Head Wo Contrast 04/20/24: No acute intracranial abnormality. Bilateral mastoid effusions. CT Head Wo Contrast 08/03/22: Moderate fluid in the bilateral mastoid air cells on the right greater than left.  CT Maxillofacial Wo Contrast 10/19/19: The mastoid air cells are clear.  I reviewed outside medical records.  Assessment and Plan The patient is a 68 y.o. female with hearing loss and recurrent ear infections. We discussed options including continued observation, and surgery for chronic otitis media. She has tried antibiotics and drops and continues to have issues. I recommended surgery; this would entail left tympanomastoidectomy and bilateral ear tube placement. A long discussion was had with the patient regarding the benefits, alternatives and risks of this procedure. Risks discussed included, but were not limited to bleeding, infection, need for further surgery, taste change, dizziness, facial nerve injury and hearing loss. They (their family) understand and wish to proceed. My scheduler will contact them to start the  scheduling process. Regarding HL, I believe the patient would benefit from amplification if desired.  I appreciate the opportunity to participate in her care.  Scribed for Dr. Donnice CHRISTELLA Coleridge, MD, by Lynnie Bowie, medical scribe, on 05/17/2024 at 10:50 AM.  I, Dr. Donnice CHRISTELLA Coleridge, MD have personally reviewed and agree with the information entered by the scribe.

## 2024-05-17 NOTE — Progress Notes (Signed)
 [facility] Department of Audiology  AUDIOLOGIC EVALUATION REPORT   PATIENT: Jacqueline Perez, Jacqueline Perez DOB: [birth year] MRN: [id number] DOS: 05/17/2024  PLEASE SEE AUDIOGRAM INCLUDING FULL REPORT IN MEDIA MANAGER TAB.  HISTORY   [Pain 0/10]  Jacqueline Perez is a 68 y.o. individual seen by Audiology for a hearing evaluation in conjunction with ENT visit; patient is seeing Dr. Donnice Coleridge today. Today, patient reports recent ear infection, bloody ear drainage, aural fullness, muffled hearing, and tinnitus. Patient describes dizziness with changes in position. Patient denies a history of ear surgeries at today.  RESULTS   Otoscopy RIGHT Ear: clear external auditory canal LEFT Ear: clear external auditory canal  Tympanometry - using a 226 Hz probe tone RIGHT Ear: Type C tympanogram, consistent with negative middle ear pressure LEFT Ear: Type B tympanogram, consistent with abnormal middle ear function  Pure Tone and Speech Audiometry Today's behavioral evaluation was completed using conventional audiometry via insert earphones with good reliability.   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  RECOMMENDATIONS   . ENT - Seeing Dr. Donnice Coleridge today . Re-evaluate hearing per ENT request, sooner should concerns arise  ALYSSA R FLIPPO, AUD, AuD Clinical Audiologist [facility] Scheduling [phone]  Charges associated with this visit: CPT 92557 - Comprehensive Audio Eval & Speech Recognition CPT [phone] - Tympanometry  Visit Time: 30 min  Access your Audiogram via  MyChart: - Log into myChart: Menu > Document Center > Medical Record Requests - Click the hyperlink 'Request Medical Records'  - Fill out fields + Select Audiograms option under 'Specific Diagnostic Images' > Submit

## 2024-05-21 ENCOUNTER — Emergency Department
Admission: EM | Admit: 2024-05-21 | Discharge: 2024-05-21 | Discharge status: Against Medical Advice | Attending: Emergency Medicine | Admitting: Emergency Medicine

## 2024-05-21 ENCOUNTER — Encounter: Payer: Self-pay | Admitting: Emergency Medicine

## 2024-05-21 ENCOUNTER — Other Ambulatory Visit: Payer: Self-pay

## 2024-05-21 DIAGNOSIS — I10 Essential (primary) hypertension: Secondary | ICD-10-CM | POA: Insufficient documentation

## 2024-05-21 DIAGNOSIS — R519 Headache, unspecified: Secondary | ICD-10-CM | POA: Diagnosis present

## 2024-05-21 DIAGNOSIS — F1310 Sedative, hypnotic or anxiolytic abuse, uncomplicated: Secondary | ICD-10-CM | POA: Diagnosis not present

## 2024-05-21 DIAGNOSIS — M791 Myalgia, unspecified site: Secondary | ICD-10-CM | POA: Insufficient documentation

## 2024-05-21 DIAGNOSIS — F419 Anxiety disorder, unspecified: Secondary | ICD-10-CM | POA: Insufficient documentation

## 2024-05-21 DIAGNOSIS — D72829 Elevated white blood cell count, unspecified: Secondary | ICD-10-CM | POA: Insufficient documentation

## 2024-05-21 DIAGNOSIS — E871 Hypo-osmolality and hyponatremia: Secondary | ICD-10-CM | POA: Insufficient documentation

## 2024-05-21 DIAGNOSIS — Z5321 Procedure and treatment not carried out due to patient leaving prior to being seen by health care provider: Secondary | ICD-10-CM | POA: Insufficient documentation

## 2024-05-21 DIAGNOSIS — F1410 Cocaine abuse, uncomplicated: Secondary | ICD-10-CM | POA: Insufficient documentation

## 2024-05-21 LAB — URINALYSIS, ROUTINE W REFLEX MICROSCOPIC
Bilirubin Urine: NEGATIVE
Glucose, UA: NEGATIVE mg/dL
Hgb urine dipstick: NEGATIVE
Ketones, ur: NEGATIVE mg/dL
Leukocytes,Ua: NEGATIVE
Nitrite: NEGATIVE
Protein, ur: NEGATIVE mg/dL
Specific Gravity, Urine: 1.012 (ref 1.005–1.030)
pH: 6 (ref 5.0–8.0)

## 2024-05-21 LAB — URINE DRUG SCREEN, QUALITATIVE (ARMC ONLY)
Amphetamines, Ur Screen: NOT DETECTED
Barbiturates, Ur Screen: NOT DETECTED
Benzodiazepine, Ur Scrn: POSITIVE — AB
Cannabinoid 50 Ng, Ur ~~LOC~~: NOT DETECTED
Cocaine Metabolite,Ur ~~LOC~~: NOT DETECTED
MDMA (Ecstasy)Ur Screen: NOT DETECTED
Methadone Scn, Ur: NOT DETECTED
Opiate, Ur Screen: POSITIVE — AB
Phencyclidine (PCP) Ur S: NOT DETECTED
Tricyclic, Ur Screen: POSITIVE — AB

## 2024-05-21 NOTE — ED Triage Notes (Signed)
 Pt presents to the ED via POV with complaints of headache x 2 days. Pt states my head hurts so bad down to my scalp. A&Ox4 at this time. Denies CP or SOB.     Pt refused labs & temp in triage.

## 2024-05-22 ENCOUNTER — Other Ambulatory Visit: Payer: Self-pay

## 2024-05-22 ENCOUNTER — Emergency Department

## 2024-05-22 ENCOUNTER — Encounter: Payer: Self-pay | Admitting: Emergency Medicine

## 2024-05-22 ENCOUNTER — Emergency Department
Admission: EM | Admit: 2024-05-22 | Discharge: 2024-05-22 | Disposition: A | Discharge status: Home / Self Care | Source: Home / Self Care | Attending: Emergency Medicine | Admitting: Emergency Medicine

## 2024-05-22 DIAGNOSIS — E871 Hypo-osmolality and hyponatremia: Secondary | ICD-10-CM

## 2024-05-22 DIAGNOSIS — M791 Myalgia, unspecified site: Secondary | ICD-10-CM

## 2024-05-22 DIAGNOSIS — R519 Headache, unspecified: Secondary | ICD-10-CM

## 2024-05-22 LAB — CBC WITH DIFFERENTIAL/PLATELET
Abs Immature Granulocytes: 0.12 K/uL — ABNORMAL HIGH (ref 0.00–0.07)
Basophils Absolute: 0.1 K/uL (ref 0.0–0.1)
Basophils Relative: 0 %
Eosinophils Absolute: 0.2 K/uL (ref 0.0–0.5)
Eosinophils Relative: 1 %
HCT: 34.8 % — ABNORMAL LOW (ref 36.0–46.0)
Hemoglobin: 12.4 g/dL (ref 12.0–15.0)
Immature Granulocytes: 1 %
Lymphocytes Relative: 34 %
Lymphs Abs: 4.3 K/uL — ABNORMAL HIGH (ref 0.7–4.0)
MCH: 35.3 pg — ABNORMAL HIGH (ref 26.0–34.0)
MCHC: 35.6 g/dL (ref 30.0–36.0)
MCV: 99.1 fL (ref 80.0–100.0)
Monocytes Absolute: 1.2 K/uL — ABNORMAL HIGH (ref 0.1–1.0)
Monocytes Relative: 9 %
Neutro Abs: 7.1 K/uL (ref 1.7–7.7)
Neutrophils Relative %: 55 %
Platelets: 345 K/uL (ref 150–400)
RBC: 3.51 MIL/uL — ABNORMAL LOW (ref 3.87–5.11)
RDW: 12.4 % (ref 11.5–15.5)
WBC: 12.9 K/uL — ABNORMAL HIGH (ref 4.0–10.5)
nRBC: 0 % (ref 0.0–0.2)

## 2024-05-22 LAB — COMPREHENSIVE METABOLIC PANEL WITH GFR
ALT: 13 U/L (ref 0–44)
AST: 18 U/L (ref 15–41)
Albumin: 3.4 g/dL — ABNORMAL LOW (ref 3.5–5.0)
Alkaline Phosphatase: 50 U/L (ref 38–126)
Anion gap: 10 (ref 5–15)
BUN: 14 mg/dL (ref 8–23)
CO2: 25 mmol/L (ref 22–32)
Calcium: 9.3 mg/dL (ref 8.9–10.3)
Chloride: 93 mmol/L — ABNORMAL LOW (ref 98–111)
Creatinine, Ser: 0.62 mg/dL (ref 0.44–1.00)
GFR, Estimated: >60 mL/min (ref >60)
Glucose, Bld: 89 mg/dL (ref 70–99)
Potassium: 3.9 mmol/L (ref 3.5–5.1)
Sodium: 128 mmol/L — ABNORMAL LOW (ref 135–145)
Total Bilirubin: 0.7 mg/dL (ref 0.0–1.2)
Total Protein: 6 g/dL — ABNORMAL LOW (ref 6.5–8.1)

## 2024-05-22 LAB — TROPONIN I (HIGH SENSITIVITY): Troponin I (High Sensitivity): 7 ng/L (ref <18)

## 2024-05-22 MED ORDER — SODIUM CHLORIDE 0.9 % IV BOLUS
1000.0000 mL | Freq: Once | INTRAVENOUS | Status: AC
  Administered 2024-05-22: 1000 mL via INTRAVENOUS

## 2024-05-22 MED ORDER — KETOROLAC TROMETHAMINE 15 MG/ML IJ SOLN
15.0000 mg | Freq: Once | INTRAMUSCULAR | Status: AC
  Administered 2024-05-22: 15 mg via INTRAVENOUS
  Filled 2024-05-22: qty 1

## 2024-05-22 NOTE — ED Provider Notes (Signed)
 [facility] Provider Note    Event Date/Time   First MD Initiated Contact with Patient 05/22/24 [phone]     (approximate)   History   Headache   HPI  Jacqueline Perez is a 68 y.o. female past medical history significant for hypertension, hyperlipidemia, anxiety, who presents to the emergency department for not feeling well.  States that she is hurting all over and feeling very anxious.  States that she has been having an ongoing headache.  States that she took 10 earlier but did not want a wait and then she returned.  Denies any falls or trauma.  No change in vision.  Denies trouble with her speech or trouble swallowing.  Denies extremity weakness.  Does endorse feeling cold in both of her feet and intermittently numb.  Denies falls or trauma.     Physical Exam   Triage Vital Signs: ED Triage Vitals  Encounter Vitals Group     BP 05/22/24 0024 (!) 140/78     Girls Systolic BP Percentile --      Girls Diastolic BP Percentile --      Boys Systolic BP Percentile --      Boys Diastolic BP Percentile --      Pulse Rate 05/22/24 0024 79     Resp --      Temp 05/22/24 0024 98.2 F (36.8 C)     Temp Source 05/22/24 0024 Oral     SpO2 05/22/24 0002 98 %     Weight 05/22/24 0021 110 lb 3.7 oz (50 kg)     Height 05/22/24 0021 5' 2 (1.575 m)     Head Circumference --      Peak Flow --      Pain Score 05/22/24 0021 9     Pain Loc --      Pain Education --      Exclude from Growth Chart --     Most recent vital signs: Vitals:   05/22/24 0428 05/22/24 0600  BP: (!) 161/81 (!) 160/81  Pulse: 72 72  Resp: 20 16  Temp: 98.3 F (36.8 C) 98.2 F (36.8 C)  SpO2: 100% 100%    Physical Exam Constitutional:      Appearance: She is well-developed.  HENT:     Head: Atraumatic.  Eyes:     Conjunctiva/sclera: Conjunctivae normal.  Cardiovascular:     Rate and Rhythm: Regular rhythm.  Pulmonary:     Effort: No respiratory distress.  Abdominal:      General: There is no distension.  Musculoskeletal:        General: Normal range of motion.     Cervical back: Normal range of motion.  Skin:    General: Skin is warm.  Neurological:     Mental Status: She is alert. Mental status is at baseline.     GCS: GCS eye subscore is 4. GCS verbal subscore is 5. GCS motor subscore is 6.     Cranial Nerves: Cranial nerves 2-12 are intact.     Sensory: Sensation is intact.     Motor: Motor function is intact.     Coordination: Coordination is intact.     IMPRESSION / MDM / ASSESSMENT AND PLAN / ED COURSE  I reviewed the triage vital signs and the nursing notes.  Differential diagnosis including intracranial hemorrhage, migraine headache, electrolyte abnormality, dehydration, chronic pain, ACS  EKG  I, Clotilda Punter, the attending physician, personally viewed and interpreted this ECG.   Rate:  Normal  Rhythm: Normal sinus  Axis: Normal  Intervals: Normal  ST&T Change: None  No tachycardic or bradycardic dysrhythmias while on cardiac telemetry.  RADIOLOGY CT scan of the head with no acute findings.  Bilateral mastoid effusions.  LABS (all labs ordered are listed, but only abnormal results are displayed) Labs interpreted as -    Labs Reviewed  CBC WITH DIFFERENTIAL/PLATELET - Abnormal; Notable for the following components:      Result Value   WBC 12.9 (*)    RBC 3.51 (*)    HCT 34.8 (*)    MCH 35.3 (*)    Lymphs Abs 4.3 (*)    Monocytes Absolute 1.2 (*)    Abs Immature Granulocytes 0.12 (*)    All other components within normal limits  COMPREHENSIVE METABOLIC PANEL WITH GFR - Abnormal; Notable for the following components:   Sodium 128 (*)    Chloride 93 (*)    Total Protein 6.0 (*)    Albumin 3.4 (*)    All other components within normal limits  TROPONIN I (HIGH SENSITIVITY)     MDM    Low leukocytosis of 12 point billiard.  Platelets normal.  Hemoglobin stable at 12.4  Mild hyponatremia which appears chronically  low but worse today at 128.  CT scan of the head with no signs of acute intracranial hemorrhage.  Does show bilateral mastoid effusions.  Patient was given IV fluids and IV ketorolac .  Patient has good and intact pulses to bilateral upper and lower extremities have a low suspicion for dissection.  No meningismus on exam.  No midline cervical, thoracic or lumbar tenderness to palpation.  On reevaluation patient is sleeping and states she is feeling better.  Discussed follow-up with primary care physician.  Given improvement of her symptoms have a low suspicion for cerebral venous thrombosis.  Continues to have a nonfocal neurologic exam.  Urine obtained earlier today that did not show any signs of urinary tract infection.  Did have positive opioids, tricyclic's and benzos.  No signs or symptoms on exam consistent with mastoiditis.  Discussed return for any ongoing or worsening symptoms.  No questions at time of discharge.   PROCEDURES:  Critical Care performed: No  Procedures  Patient's presentation is most consistent with acute complicated illness / injury requiring diagnostic workup.   MEDICATIONS ORDERED IN ED: Medications  sodium chloride  0.9 % bolus 1,000 mL (0 mLs Intravenous Stopped 05/22/24 0528)  ketorolac  (TORADOL ) 15 MG/ML injection 15 mg (15 mg Intravenous Given 05/22/24 0430)    FINAL CLINICAL IMPRESSION(S) / ED DIAGNOSES   Final diagnoses:  Headache disorder  Myalgia  Hyponatremia     Rx / DC Orders   ED Discharge Orders     None        Note:  This document was prepared using Dragon voice recognition software and may include unintentional dictation errors.   Suzanne Kirsch, MD 05/22/24 0800

## 2024-05-22 NOTE — Discharge Instructions (Addendum)
 You are seen in the emergency department for headache and full body pain.  You had a CT scan of your head and lab work done - your sodium was mildly low today.  It is importantly follow-up closely with your primary care physician for reevaluation and repeat lab work.  Return to the emergency department if you have any return of your symptoms.  Stay hydrated and drink plenty of fluids.

## 2024-05-22 NOTE — ED Notes (Signed)
 Spoke w/ Ronal who is on pt's emergency contact list. She verbalizes she will come get pt. Pt to lobby to wait.

## 2024-05-22 NOTE — ED Triage Notes (Signed)
 Pt arrives via EMS shortly after eloping from [facility] due to long wait; pt c/o generalized HA x 3 days;

## 2024-05-24 ENCOUNTER — Other Ambulatory Visit: Payer: Self-pay

## 2024-05-24 ENCOUNTER — Emergency Department
Admission: EM | Admit: 2024-05-24 | Discharge: 2024-05-25 | Disposition: A | Discharge status: Home / Self Care | Attending: Emergency Medicine | Admitting: Emergency Medicine

## 2024-05-24 DIAGNOSIS — R569 Unspecified convulsions: Secondary | ICD-10-CM | POA: Diagnosis present

## 2024-05-24 MED ORDER — PHENOBARBITAL SODIUM 130 MG/ML IJ SOLN
130.0000 mg | Freq: Once | INTRAMUSCULAR | Status: AC
  Administered 2024-05-24: 130 mg via INTRAVENOUS
  Filled 2024-05-24: qty 1

## 2024-05-24 NOTE — ED Triage Notes (Signed)
 Patient brought in via [facility] today from home with complaints of multiples seizures. Patient arrived actively seizing. EMS administered a total of 5mg  versed en route. Patient is snoring and post ictal after seizure ended. Seizure precautions in place.

## 2024-05-25 ENCOUNTER — Emergency Department

## 2024-05-25 LAB — CBC WITH DIFFERENTIAL/PLATELET
Abs Immature Granulocytes: 0.07 K/uL (ref 0.00–0.07)
Basophils Absolute: 0 K/uL (ref 0.0–0.1)
Basophils Relative: 0 %
Eosinophils Absolute: 0.1 K/uL (ref 0.0–0.5)
Eosinophils Relative: 0 %
HCT: 32.8 % — ABNORMAL LOW (ref 36.0–46.0)
Hemoglobin: 11.1 g/dL — ABNORMAL LOW (ref 12.0–15.0)
Immature Granulocytes: 1 %
Lymphocytes Relative: 10 %
Lymphs Abs: 1.3 K/uL (ref 0.7–4.0)
MCH: 35.4 pg — ABNORMAL HIGH (ref 26.0–34.0)
MCHC: 33.8 g/dL (ref 30.0–36.0)
MCV: 104.5 fL — ABNORMAL HIGH (ref 80.0–100.0)
Monocytes Absolute: 0.6 K/uL (ref 0.1–1.0)
Monocytes Relative: 5 %
Neutro Abs: 10.2 K/uL — ABNORMAL HIGH (ref 1.7–7.7)
Neutrophils Relative %: 84 %
Platelets: 330 K/uL (ref 150–400)
RBC: 3.14 MIL/uL — ABNORMAL LOW (ref 3.87–5.11)
RDW: 12.8 % (ref 11.5–15.5)
WBC: 12.2 K/uL — ABNORMAL HIGH (ref 4.0–10.5)
nRBC: 0 % (ref 0.0–0.2)

## 2024-05-25 LAB — COMPREHENSIVE METABOLIC PANEL WITH GFR
ALT: 10 U/L (ref 0–44)
AST: 26 U/L (ref 15–41)
Albumin: 3.1 g/dL — ABNORMAL LOW (ref 3.5–5.0)
Alkaline Phosphatase: 44 U/L (ref 38–126)
Anion gap: 11 (ref 5–15)
BUN: 8 mg/dL (ref 8–23)
CO2: 20 mmol/L — ABNORMAL LOW (ref 22–32)
Calcium: 8 mg/dL — ABNORMAL LOW (ref 8.9–10.3)
Chloride: 107 mmol/L (ref 98–111)
Creatinine, Ser: 0.47 mg/dL (ref 0.44–1.00)
GFR, Estimated: >60 mL/min (ref >60)
Glucose, Bld: 148 mg/dL — ABNORMAL HIGH (ref 70–99)
Potassium: 3.3 mmol/L — ABNORMAL LOW (ref 3.5–5.1)
Sodium: 138 mmol/L (ref 135–145)
Total Bilirubin: 0.6 mg/dL (ref 0.0–1.2)
Total Protein: 5.7 g/dL — ABNORMAL LOW (ref 6.5–8.1)

## 2024-05-25 LAB — URINALYSIS, W/ REFLEX TO CULTURE (INFECTION SUSPECTED)
Bacteria, UA: NONE SEEN
Bilirubin Urine: NEGATIVE
Glucose, UA: NEGATIVE mg/dL
Hgb urine dipstick: NEGATIVE
Ketones, ur: NEGATIVE mg/dL
Leukocytes,Ua: NEGATIVE
Nitrite: NEGATIVE
Protein, ur: NEGATIVE mg/dL
RBC / HPF: 0 RBC/hpf (ref 0–5)
Specific Gravity, Urine: 1.019 (ref 1.005–1.030)
pH: 5 (ref 5.0–8.0)

## 2024-05-25 LAB — ETHANOL: Alcohol, Ethyl (B): <15 mg/dL (ref <15)

## 2024-05-25 MED ORDER — KETOROLAC TROMETHAMINE 30 MG/ML IJ SOLN
15.0000 mg | Freq: Once | INTRAMUSCULAR | Status: AC
  Administered 2024-05-25: 15 mg via INTRAVENOUS
  Filled 2024-05-25: qty 1
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
## 2024-05-25 NOTE — ED Notes (Signed)
 Family was able to place patient into wheelchair and wheeled her out of the ER. Patient was alert, talking, and in no acute distress when exiting the building.

## 2024-05-25 NOTE — ED Notes (Signed)
 This RN gave toradol  for patient's headache. Patient then began screaming and cussing at this RN, cussing about having an IV which has been in since she arrived. Family in room are attempting to calm down patient but she is still currently screaming and cussing, she has made family leave the room.

## 2024-05-25 NOTE — ED Provider Notes (Signed)
 [facility] Provider Note   Event Date/Time   First MD Initiated Contact with Patient 05/24/24 2348     (approximate) History  Seizures  HPI Jacqueline Perez is a 68 y.o. female with a past medical history of polysubstance abuse who presents via EMS after multiple episodes of seizure-like activity.  Patient received 2 mg and then another 2-1/2 mg of Versed due to the seizure activity.  Further history and review of systems are unable to be obtained at this time secondary to patient's mental status ROS: Unable to assess   Physical Exam  Triage Vital Signs: ED Triage Vitals  Encounter Vitals Group     BP 05/24/24 2347 120/88     Girls Systolic BP Percentile --      Girls Diastolic BP Percentile --      Boys Systolic BP Percentile --      Boys Diastolic BP Percentile --      Pulse Rate 05/24/24 2346 (!) 113     Resp 05/24/24 2346 20     Temp 05/24/24 2346 99.7 F (37.6 C)     Temp Source 05/24/24 2346 Axillary     SpO2 05/24/24 2346 93 %     Weight 05/24/24 2347 110 lb 3.7 oz (50 kg)     Height 05/24/24 2347 5' 2 (1.575 m)     Head Circumference --      Peak Flow --      Pain Score --      Pain Loc --      Pain Education --      Exclude from Growth Chart --    Most recent vital signs: Vitals:   05/25/24 0230 05/25/24 0330  BP: (!) 147/91 (!) 148/89  Pulse: 95 99  Resp: 18 14  Temp:    SpO2: 97% 94%   General: Disheveled elderly Caucasian female CV:  Good peripheral perfusion. Resp:  Normal effort. Abd:  No distention. Other:  Elderly female asleep on bed with fasciculation of the right masseter muscle and tremulousness in bilateral upper extremities.  ED Results / Procedures / Treatments  Labs (all labs ordered are listed, but only abnormal results are displayed) Labs Reviewed  URINALYSIS, W/ REFLEX TO CULTURE (INFECTION SUSPECTED) - Abnormal; Notable for the following components:      Result Value   Color, Urine YELLOW (*)    APPearance  CLEAR (*)    All other components within normal limits  CBC WITH DIFFERENTIAL/PLATELET - Abnormal; Notable for the following components:   WBC 12.2 (*)    RBC 3.14 (*)    Hemoglobin 11.1 (*)    HCT 32.8 (*)    MCV 104.5 (*)    MCH 35.4 (*)    Neutro Abs 10.2 (*)    All other components within normal limits  COMPREHENSIVE METABOLIC PANEL WITH GFR - Abnormal; Notable for the following components:   Potassium 3.3 (*)    CO2 20 (*)    Glucose, Bld 148 (*)    Calcium 8.0 (*)    Total Protein 5.7 (*)    Albumin 3.1 (*)    All other components within normal limits  ETHANOL   PROCEDURES: Critical Care performed: No Procedures MEDICATIONS ORDERED IN ED: Medications  PHENObarbital  (LUMINAL) injection 130 mg (130 mg Intravenous Given 05/24/24 2356)  ketorolac  (TORADOL ) 30 MG/ML injection 15 mg (15 mg Intravenous Given 05/25/24 0506)   IMPRESSION / MDM / ASSESSMENT AND PLAN / ED COURSE  I reviewed  the triage vital signs and the nursing notes.                             The patient is on the cardiac monitor to evaluate for evidence of arrhythmia and/or significant heart rate changes. Patient's presentation is most consistent with acute presentation with potential threat to life or bodily function. Patient presents after recent seizure episode.  Patient had slow return to baseline mental and physical function per EMS. No immunosuppresion hx and had no preceding fever. No history of alcohol abuse or suspicion for toxin ingestion. Unlikely stroke, syncope. Unlikely infectious etiology. No preceding trauma.  Workup: EKG, BMP, POCT glucose (pregnancy test if female) and CT Brain.  Field Interventions: 4.5 mg Versed ED Interventions: 130 mg phenobarbital  IV  Upon awakening, patient began demanding medications for headache including screaming at staff and threatening violence.  Patient was told that she may have some Toradol  for her headache to which she responded you can take some  Toradol  up your ass.  Patient was informed that if she was refusing any further treatment, she is stable for discharge at this time.  Patient agrees with plan for discharge Disposition: Discharge home with primary care follow up in next 24-48 hours.   FINAL CLINICAL IMPRESSION(S) / ED DIAGNOSES   Final diagnoses:  Seizure (HCC)   Rx / DC Orders   ED Discharge Orders     None      Note:  This document was prepared using Dragon voice recognition software and may include unintentional dictation errors.   Amirra Herling K, MD 05/25/24 [phone]
